# Patient Record
Sex: Female | Born: 1985 | Race: White | Hispanic: No | Marital: Married | Smoking: Former smoker
Health system: Southern US, Community
[De-identification: ages and names within clinical notes are randomized; demographics above are authoritative.]

## PROBLEM LIST (undated history)

## (undated) ENCOUNTER — Inpatient Hospital Stay (HOSPITAL_COMMUNITY): Payer: Self-pay

## (undated) DIAGNOSIS — R87619 Unspecified abnormal cytological findings in specimens from cervix uteri: Secondary | ICD-10-CM

## (undated) DIAGNOSIS — J019 Acute sinusitis, unspecified: Secondary | ICD-10-CM

## (undated) DIAGNOSIS — E349 Endocrine disorder, unspecified: Secondary | ICD-10-CM

## (undated) DIAGNOSIS — B019 Varicella without complication: Secondary | ICD-10-CM

## (undated) DIAGNOSIS — A6000 Herpesviral infection of urogenital system, unspecified: Secondary | ICD-10-CM

## (undated) DIAGNOSIS — E282 Polycystic ovarian syndrome: Secondary | ICD-10-CM

## (undated) DIAGNOSIS — IMO0002 Reserved for concepts with insufficient information to code with codable children: Secondary | ICD-10-CM

## (undated) DIAGNOSIS — E669 Obesity, unspecified: Secondary | ICD-10-CM

## (undated) DIAGNOSIS — Z8619 Personal history of other infectious and parasitic diseases: Secondary | ICD-10-CM

## (undated) HISTORY — DX: Herpesviral infection of urogenital system, unspecified: A60.00

## (undated) HISTORY — DX: Polycystic ovarian syndrome: E28.2

## (undated) HISTORY — PX: NO PAST SURGERIES: SHX2092

## (undated) HISTORY — DX: Acute sinusitis, unspecified: J01.90

## (undated) HISTORY — DX: Varicella without complication: B01.9

## (undated) HISTORY — DX: Reserved for concepts with insufficient information to code with codable children: IMO0002

## (undated) HISTORY — DX: Personal history of other infectious and parasitic diseases: Z86.19

## (undated) HISTORY — DX: Obesity, unspecified: E66.9

## (undated) HISTORY — DX: Unspecified abnormal cytological findings in specimens from cervix uteri: R87.619

---

## 2008-08-22 ENCOUNTER — Encounter: Payer: Self-pay | Admitting: Family Medicine

## 2008-09-02 DIAGNOSIS — A6000 Herpesviral infection of urogenital system, unspecified: Secondary | ICD-10-CM

## 2008-09-02 HISTORY — DX: Herpesviral infection of urogenital system, unspecified: A60.00

## 2008-12-23 ENCOUNTER — Encounter: Admission: RE | Admit: 2008-12-23 | Discharge: 2008-12-23 | Payer: Self-pay | Admitting: Endocrinology

## 2009-07-26 ENCOUNTER — Ambulatory Visit: Payer: Self-pay | Admitting: Family Medicine

## 2009-07-31 LAB — CONVERTED CEMR LAB
AST: 19 units/L (ref 0–37)
Alkaline Phosphatase: 106 units/L (ref 39–117)
Basophils Absolute: 0.1 10*3/uL (ref 0.0–0.1)
Bilirubin, Direct: 0.1 mg/dL (ref 0.0–0.3)
Calcium: 9.3 mg/dL (ref 8.4–10.5)
Creatinine, Ser: 0.6 mg/dL (ref 0.4–1.2)
Eosinophils Absolute: 0.1 10*3/uL (ref 0.0–0.7)
GFR calc non Af Amer: 133.01 mL/min (ref >60)
Glucose, Bld: 88 mg/dL (ref 70–99)
HDL: 37.4 mg/dL — ABNORMAL LOW (ref >39.00)
Hemoglobin: 13.9 g/dL (ref 12.0–15.0)
Lymphocytes Relative: 21.8 % (ref 12.0–46.0)
Monocytes Relative: 5.3 % (ref 3.0–12.0)
Neutro Abs: 6.4 10*3/uL (ref 1.4–7.7)
Neutrophils Relative %: 71.3 % (ref 43.0–77.0)
Platelets: 264 10*3/uL (ref 150.0–400.0)
RDW: 12.7 % (ref 11.5–14.6)
Sodium: 142 meq/L (ref 135–145)
Total Bilirubin: 1 mg/dL (ref 0.3–1.2)
Triglycerides: 42 mg/dL (ref 0.0–149.0)
VLDL: 8.4 mg/dL (ref 0.0–40.0)

## 2009-10-09 ENCOUNTER — Ambulatory Visit: Payer: Self-pay | Admitting: Family Medicine

## 2009-10-09 DIAGNOSIS — J019 Acute sinusitis, unspecified: Secondary | ICD-10-CM

## 2009-10-09 HISTORY — DX: Acute sinusitis, unspecified: J01.90

## 2010-10-02 NOTE — Assessment & Plan Note (Signed)
Summary: CONCERNS W/ BRONCHITIS? // RS   Vital Signs:  Patient profile:   25 year old female Menstrual status:  regular Temp:     98.1 degrees F oral BP sitting:   110 / 80  (left arm) Cuff size:   large  Vitals Entered By: Sid Falcon LPN (October 09, 2009 12:00 PM) CC: Congestion, cough X 3 weeks   History of Present Illness: Acute visit.   3 week history of intermittent cough mostly productive and nasal congestive symptoms. Intermittent maxillary facial pain. Intermittent upper teeth pain and headaches. No fever. No wheezing. Robitussin-DM for cough without much improvement. Getting very little sleep secondary to cough. Nonsmoker.  Allergies (verified): No Known Drug Allergies  Past History:  Past Medical History: Last updated: 07/26/2009 Chicken pox PCOS  Social History: Last updated: 07/26/2009 Occupation:  Certified Nursing Assistant Single Never Smoked Alcohol use-no Regular exercise-no  Review of Systems  The patient denies anorexia, fever, weight loss, syncope, dyspnea on exertion, peripheral edema, hemoptysis, and chest pain.    Physical Exam  General:  Well-developed,well-nourished,in no acute distress; alert,appropriate and cooperative throughout examination Eyes:  No corneal or conjunctival inflammation noted. EOMI. Perrla. Funduscopic exam benign, without hemorrhages, exudates or papilledema. Vision grossly normal. Ears:  External ear exam shows no significant lesions or deformities.  Otoscopic examination reveals clear canals, tympanic membranes are intact bilaterally without bulging, retraction, inflammation or discharge. Hearing is grossly normal bilaterally. Nose:  External nasal examination shows no deformity or inflammation. Nasal mucosa are pink and moist without lesions or exudates. Mouth:   yellow mucus noted posterior pharynx otherwise clear Neck:  No deformities, masses, or tenderness noted. Lungs:  Normal respiratory effort, chest expands  symmetrically. Lungs are clear to auscultation, no crackles or wheezes. Heart:  Normal rate and regular rhythm. S1 and S2 normal without gallop, murmur, click, rub or other extra sounds.   Impression & Recommendations:  Problem # 1:  SINUSITIS, ACUTE (ICD-461.9)  Her updated medication list for this problem includes:    Hydrocodone-homatropine 5-1.5 Mg/48ml Syrp (Hydrocodone-homatropine) ..... One tsp by mouth q 4-6 hours as needed cough    Amoxicillin-pot Clavulanate 875-125 Mg Tabs (Amoxicillin-pot clavulanate) ..... One by mouth two times a day for 10 days  Complete Medication List: 1)  Metformin Hcl 500 Mg Tabs (Metformin hcl) .... One tab two times a day after breakfast and dinner 2)  Hydrocodone-homatropine 5-1.5 Mg/35ml Syrp (Hydrocodone-homatropine) .... One tsp by mouth q 4-6 hours as needed cough 3)  Amoxicillin-pot Clavulanate 875-125 Mg Tabs (Amoxicillin-pot clavulanate) .... One by mouth two times a day for 10 days  Patient Instructions: 1)  Acute sinusitis symptoms for less than 10 days are not helped by antibiotics. Use warm moist compresses, and over the counter decongestants( only as directed). Call if no improvement in 5-7 days, sooner if increasing pain, fever, or new symptoms.  Prescriptions: AMOXICILLIN-POT CLAVULANATE 875-125 MG TABS (AMOXICILLIN-POT CLAVULANATE) one by mouth two times a day for 10 days  #20 x 0   Entered and Authorized by:   Evelena Peat MD   Signed by:   Evelena Peat MD on 10/09/2009   Method used:   Print then Give to Patient   RxID:   [id number] HYDROCODONE-HOMATROPINE 5-1.5 MG/5ML SYRP (HYDROCODONE-HOMATROPINE) one tsp by mouth q 4-6 hours as needed cough  #120 ml x 0   Entered and Authorized by:   Evelena Peat MD   Signed by:   Evelena Peat MD on 10/09/2009   Method used:  Print then Give to Patient   RxID:   [id number]

## 2011-02-21 ENCOUNTER — Emergency Department (HOSPITAL_COMMUNITY)
Admission: EM | Admit: 2011-02-21 | Discharge: 2011-02-21 | Disposition: A | Discharge status: Home / Self Care | Payer: Worker's Compensation | Attending: Emergency Medicine | Admitting: Emergency Medicine

## 2011-02-21 DIAGNOSIS — M5430 Sciatica, unspecified side: Secondary | ICD-10-CM | POA: Insufficient documentation

## 2011-09-03 NOTE — L&D Delivery Note (Signed)
Delivery Note Pt reached complete dilation and pushed very well with delivery of vertex within 45 minutes of pushing.  Moderate shoulder dystocia encountered, resolved with MacRoberts and woodscrew maneuver.  At 6:34 PM a viable female was delivered via  (Presentation:ROA).  APGAR: , 9; weight 7 lb 12.6 oz (3532 g).  Terminal meconium noted. Placenta status:delivered spontaneously , .  Cord:  with the following complications:none .  Anesthesia:  epidural Episiotomy: n/a Lacerations: first degree Suture Repair: 3.0 vicryl rapide Est. Blood Loss (mL): 350cc  Mom to postpartum.  Baby to stay with mother. Maternal temp noted with pushing to 100.1, will observe for resolution. Gail Browning 07/14/2012, 6:58 PM

## 2011-11-05 ENCOUNTER — Encounter: Payer: Self-pay | Admitting: Family Medicine

## 2011-11-05 ENCOUNTER — Ambulatory Visit (INDEPENDENT_AMBULATORY_CARE_PROVIDER_SITE_OTHER): Payer: Worker's Compensation | Admitting: Family Medicine

## 2011-11-05 DIAGNOSIS — J209 Acute bronchitis, unspecified: Secondary | ICD-10-CM

## 2011-11-05 MED ORDER — HYDROCODONE-HOMATROPINE 5-1.5 MG/5ML PO SYRP
5.0000 mL | ORAL_SOLUTION | Freq: Four times a day (QID) | ORAL | Status: AC | PRN
Start: 2011-11-05 — End: 2011-11-15

## 2011-11-05 NOTE — Progress Notes (Signed)
  Subjective:    Patient ID: Gail Browning, female    DOB: [birth year], 26 y.o.   MRN: [id number]  HPI  Acute visit. Patient had about 5-6 days of coughing. Mild nasal congestion. Denies any wheezing. No fever or chills. No dyspnea. Cough is especially bothersome at night.  She just  quit smoking yesterday. Denies any headache. No relief with over-the-counter medications. She works in nursing home. No sick exposures.   Review of Systems  Constitutional: Negative for fever, chills and unexpected weight change.  HENT: Positive for congestion.   Respiratory: Positive for cough. Negative for shortness of breath and wheezing.   Cardiovascular: Negative for chest pain, palpitations and leg swelling.  Gastrointestinal: Negative for abdominal pain.  Neurological: Negative for headaches.       Objective:   Physical Exam  Constitutional: She appears well-developed and well-nourished.  HENT:  Right Ear: External ear normal.  Left Ear: External ear normal.  Mouth/Throat: Oropharynx is clear and moist.  Neck: Neck supple.  Cardiovascular: Normal rate and regular rhythm.   Pulmonary/Chest: Effort normal and breath sounds normal. No respiratory distress. She has no wheezes. She has no rales.  Musculoskeletal: She exhibits no edema.  Lymphadenopathy:    She has no cervical adenopathy.          Assessment & Plan:  Acute bronchitis. Suspect viral. Hycodan cough syrup for nighttime suppression. Followup promptly for fever or worsening symptoms.

## 2011-11-05 NOTE — Patient Instructions (Signed)

## 2011-12-25 LAB — OB RESULTS CONSOLE HIV ANTIBODY (ROUTINE TESTING): HIV: NONREACTIVE

## 2011-12-25 LAB — OB RESULTS CONSOLE RPR: RPR: NONREACTIVE

## 2011-12-25 LAB — OB RESULTS CONSOLE GC/CHLAMYDIA
Chlamydia: NEGATIVE
Gonorrhea: NEGATIVE

## 2011-12-25 LAB — OB RESULTS CONSOLE HEPATITIS B SURFACE ANTIGEN: Hepatitis B Surface Ag: NEGATIVE

## 2011-12-25 LAB — OB RESULTS CONSOLE RUBELLA ANTIBODY, IGM: Rubella: IMMUNE

## 2012-02-18 ENCOUNTER — Encounter (HOSPITAL_COMMUNITY): Payer: Self-pay | Admitting: *Deleted

## 2012-02-18 ENCOUNTER — Emergency Department (HOSPITAL_COMMUNITY)
Admission: EM | Admit: 2012-02-18 | Discharge: 2012-02-18 | Disposition: A | Discharge status: Home / Self Care | Payer: No Typology Code available for payment source | Attending: Emergency Medicine | Admitting: Emergency Medicine

## 2012-02-18 ENCOUNTER — Emergency Department (HOSPITAL_COMMUNITY): Payer: No Typology Code available for payment source

## 2012-02-18 DIAGNOSIS — S39012A Strain of muscle, fascia and tendon of lower back, initial encounter: Secondary | ICD-10-CM

## 2012-02-18 DIAGNOSIS — S335XXA Sprain of ligaments of lumbar spine, initial encounter: Secondary | ICD-10-CM | POA: Insufficient documentation

## 2012-02-18 DIAGNOSIS — O9989 Other specified diseases and conditions complicating pregnancy, childbirth and the puerperium: Secondary | ICD-10-CM | POA: Insufficient documentation

## 2012-02-18 DIAGNOSIS — Z3490 Encounter for supervision of normal pregnancy, unspecified, unspecified trimester: Secondary | ICD-10-CM

## 2012-02-18 MED ORDER — HYDROCODONE-ACETAMINOPHEN 5-325 MG PO TABS: 1.0000 | ORAL_TABLET | Freq: Four times a day (QID) | ORAL | Status: DC | PRN

## 2012-02-18 MED ORDER — HYDROCODONE-ACETAMINOPHEN 5-325 MG PO TABS: 1.0000 | ORAL_TABLET | Freq: Four times a day (QID) | ORAL | Status: AC | PRN

## 2012-02-18 NOTE — ED Notes (Signed)
Patient transported to Ultrasound. Thayer Ohm, [place] went to bedside to speak with pt and mother after talking with OB MD.

## 2012-02-18 NOTE — Discharge Instructions (Signed)
Followup with your OB/GYN.  Return here as needed.  Go to [facility] for any worsening in your condition.

## 2012-02-18 NOTE — ED Notes (Signed)
Pt's mother arrived to bedside and upset that OB Rapid Response Nurse not at bedside. Informed pt's mother that they were contacted and recommended fetal heart tones and ultrasound. Pt's mother RN at [facility]. Also requesting pt's OBGYN to be contacted. Thayer Ohm, [place] made aware of mother's concerns and has placed call to OBGYN.

## 2012-02-18 NOTE — ED Notes (Signed)
Dr. Ellyn Hack at bedside seeing pt.

## 2012-02-18 NOTE — ED Provider Notes (Signed)
Medical screening examination/treatment/procedure(s) were performed by non-physician practitioner and as supervising physician I was immediately available for consultation/collaboration.   Loren Racer, MD 02/18/12 [phone]
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
## 2012-07-05 ENCOUNTER — Inpatient Hospital Stay (HOSPITAL_COMMUNITY)
Admission: AD | Admit: 2012-07-05 | Discharge: 2012-07-05 | Disposition: A | Discharge status: Home / Self Care | Payer: PRIVATE HEALTH INSURANCE | Source: Ambulatory Visit | Attending: Obstetrics and Gynecology | Admitting: Obstetrics and Gynecology

## 2012-07-05 ENCOUNTER — Encounter (HOSPITAL_COMMUNITY): Payer: Self-pay | Admitting: *Deleted

## 2012-07-05 DIAGNOSIS — O479 False labor, unspecified: Secondary | ICD-10-CM | POA: Insufficient documentation

## 2012-07-05 MED ORDER — ZOLPIDEM TARTRATE 5 MG PO TABS
ORAL_TABLET | ORAL | Status: AC
  Administered 2012-07-05: 10 mg
  Filled 2012-07-05: qty 2

## 2012-07-05 NOTE — MAU Note (Signed)
Pt was in MAU during epic downtime. Plan of care was discussed with Dr. Senaida Ores @ [phone]. Pt was to walk for 1-2 hours then return for a recheck. Ambien 10 mg ordered per Dr. Senaida Ores if pt makes no change and sent home. Ambien was charted in epic. Order was sent to pharmacy on paper.

## 2012-07-05 NOTE — MAU Note (Signed)
Pt reports cramping for 3 hours

## 2012-07-06 ENCOUNTER — Telehealth (HOSPITAL_COMMUNITY): Payer: Self-pay | Admitting: *Deleted

## 2012-07-07 ENCOUNTER — Encounter (HOSPITAL_COMMUNITY): Payer: Self-pay | Admitting: *Deleted

## 2012-07-07 NOTE — Telephone Encounter (Signed)
Preadmission screen  

## 2012-07-08 ENCOUNTER — Encounter (HOSPITAL_COMMUNITY): Payer: Self-pay | Admitting: *Deleted

## 2012-07-08 ENCOUNTER — Telehealth (HOSPITAL_COMMUNITY): Payer: Self-pay | Admitting: *Deleted

## 2012-07-08 NOTE — Telephone Encounter (Signed)
Preadmission screen  

## 2012-07-12 ENCOUNTER — Inpatient Hospital Stay (HOSPITAL_COMMUNITY)
Admission: AD | Admit: 2012-07-12 | Discharge: 2012-07-12 | Disposition: A | Discharge status: Home / Self Care | Payer: PRIVATE HEALTH INSURANCE | Source: Ambulatory Visit | Attending: Obstetrics and Gynecology | Admitting: Obstetrics and Gynecology

## 2012-07-12 ENCOUNTER — Encounter (HOSPITAL_COMMUNITY): Payer: Self-pay

## 2012-07-12 DIAGNOSIS — O479 False labor, unspecified: Secondary | ICD-10-CM | POA: Insufficient documentation

## 2012-07-12 HISTORY — DX: Endocrine disorder, unspecified: E34.9

## 2012-07-12 NOTE — MAU Note (Signed)
Patient states she has been having contractions every 4 minutes. Had heavy discharge yesterday and started having to wear a pad with constant leaking since 1300 on 11-9. Reports good fetal movement. No bleeding.

## 2012-07-14 ENCOUNTER — Inpatient Hospital Stay (HOSPITAL_COMMUNITY): Payer: PRIVATE HEALTH INSURANCE | Admitting: Anesthesiology

## 2012-07-14 ENCOUNTER — Encounter (HOSPITAL_COMMUNITY): Payer: Self-pay | Admitting: *Deleted

## 2012-07-14 ENCOUNTER — Inpatient Hospital Stay (HOSPITAL_COMMUNITY)
Admission: AD | Admit: 2012-07-14 | Discharge: 2012-07-16 | DRG: 774 | Disposition: A | Discharge status: Home / Self Care | Payer: PRIVATE HEALTH INSURANCE | Source: Ambulatory Visit | Attending: Obstetrics and Gynecology | Admitting: Obstetrics and Gynecology

## 2012-07-14 ENCOUNTER — Encounter (HOSPITAL_COMMUNITY): Payer: Self-pay | Admitting: Anesthesiology

## 2012-07-14 LAB — CBC
HCT: 37.8 % (ref 36.0–46.0)
Hemoglobin: 12.8 g/dL (ref 12.0–15.0)
MCV: 89.6 fL (ref 78.0–100.0)
RBC: 4.22 MIL/uL (ref 3.87–5.11)
WBC: 17.6 10*3/uL — ABNORMAL HIGH (ref 4.0–10.5)

## 2012-07-14 LAB — ABO/RH: ABO/RH(D): A POS

## 2012-07-14 LAB — TYPE AND SCREEN: Antibody Screen: NEGATIVE

## 2012-07-14 MED ORDER — DIPHENHYDRAMINE HCL 50 MG/ML IJ SOLN
12.5000 mg | INTRAMUSCULAR | Status: DC | PRN
Start: 2012-07-14 — End: 2012-07-14

## 2012-07-14 MED ORDER — SENNOSIDES-DOCUSATE SODIUM 8.6-50 MG PO TABS
2.0000 | ORAL_TABLET | Freq: Every day | ORAL | Status: DC
  Administered 2012-07-15: 2 via ORAL

## 2012-07-14 MED ORDER — LACTATED RINGERS IV SOLN: 500.0000 mL | INTRAVENOUS | Status: DC | PRN

## 2012-07-14 MED ORDER — SIMETHICONE 80 MG PO CHEW: 80.0000 mg | CHEWABLE_TABLET | ORAL | Status: DC | PRN

## 2012-07-14 MED ORDER — OXYTOCIN 40 UNITS IN LACTATED RINGERS INFUSION - SIMPLE MED
62.5000 mL/h | INTRAVENOUS | Status: DC
  Administered 2012-07-14: 62.5 mL/h via INTRAVENOUS
  Filled 2012-07-14: qty 1000

## 2012-07-14 MED ORDER — PRENATAL MULTIVITAMIN CH: 1.0000 | ORAL_TABLET | Freq: Every day | ORAL | Status: DC

## 2012-07-14 MED ORDER — LIDOCAINE HCL (PF) 1 % IJ SOLN
INTRAMUSCULAR | Status: DC | PRN
  Administered 2012-07-14 (×2): 9 mL

## 2012-07-14 MED ORDER — IBUPROFEN 600 MG PO TABS: 600.0000 mg | ORAL_TABLET | Freq: Four times a day (QID) | ORAL | Status: DC | PRN

## 2012-07-14 MED ORDER — CITRIC ACID-SODIUM CITRATE 334-500 MG/5ML PO SOLN: 30.0000 mL | ORAL | Status: DC | PRN

## 2012-07-14 MED ORDER — DIBUCAINE 1 % RE OINT: 1.0000 "application " | TOPICAL_OINTMENT | RECTAL | Status: DC | PRN

## 2012-07-14 MED ORDER — LACTATED RINGERS IV SOLN: 500.0000 mL | Freq: Once | INTRAVENOUS | Status: DC

## 2012-07-14 MED ORDER — BUTORPHANOL TARTRATE 1 MG/ML IJ SOLN
1.0000 mg | INTRAMUSCULAR | Status: DC | PRN
  Administered 2012-07-14: 1 mg via INTRAVENOUS
  Filled 2012-07-14: qty 1

## 2012-07-14 MED ORDER — METFORMIN HCL 500 MG PO TABS
500.0000 mg | ORAL_TABLET | Freq: Two times a day (BID) | ORAL | Status: DC
  Administered 2012-07-15 – 2012-07-16 (×3): 500 mg via ORAL
  Filled 2012-07-14 (×3): qty 1

## 2012-07-14 MED ORDER — WITCH HAZEL-GLYCERIN EX PADS: 1.0000 "application " | MEDICATED_PAD | CUTANEOUS | Status: DC | PRN

## 2012-07-14 MED ORDER — OXYTOCIN BOLUS FROM INFUSION: 500.0000 mL | INTRAVENOUS | Status: DC

## 2012-07-14 MED ORDER — ONDANSETRON HCL 4 MG/2ML IJ SOLN: 4.0000 mg | INTRAMUSCULAR | Status: DC | PRN

## 2012-07-14 MED ORDER — OXYCODONE-ACETAMINOPHEN 5-325 MG PO TABS
1.0000 | ORAL_TABLET | ORAL | Status: DC | PRN
Start: 2012-07-14 — End: 2012-07-14

## 2012-07-14 MED ORDER — TETANUS-DIPHTH-ACELL PERTUSSIS 5-2.5-18.5 LF-MCG/0.5 IM SUSP: 0.5000 mL | Freq: Once | INTRAMUSCULAR | Status: DC

## 2012-07-14 MED ORDER — ACETAMINOPHEN 325 MG PO TABS: 650.0000 mg | ORAL_TABLET | ORAL | Status: DC | PRN

## 2012-07-14 MED ORDER — ZOLPIDEM TARTRATE 5 MG PO TABS: 5.0000 mg | ORAL_TABLET | Freq: Every evening | ORAL | Status: DC | PRN

## 2012-07-14 MED ORDER — EPHEDRINE 5 MG/ML INJ
10.0000 mg | INTRAVENOUS | Status: DC | PRN
  Filled 2012-07-14: qty 4

## 2012-07-14 MED ORDER — ONDANSETRON HCL 4 MG PO TABS: 4.0000 mg | ORAL_TABLET | ORAL | Status: DC | PRN

## 2012-07-14 MED ORDER — PRENATAL MULTIVITAMIN CH
1.0000 | ORAL_TABLET | Freq: Every day | ORAL | Status: DC
  Administered 2012-07-15 – 2012-07-16 (×2): 1 via ORAL
  Filled 2012-07-14 (×2): qty 1

## 2012-07-14 MED ORDER — FENTANYL 2.5 MCG/ML BUPIVACAINE 1/10 % EPIDURAL INFUSION (WH - ANES)
INTRAMUSCULAR | Status: DC | PRN
  Administered 2012-07-14: 14 mL/h via EPIDURAL

## 2012-07-14 MED ORDER — LACTATED RINGERS IV SOLN
INTRAVENOUS | Status: DC
  Administered 2012-07-14: 12:00:00 via INTRAVENOUS

## 2012-07-14 MED ORDER — LIDOCAINE HCL (PF) 1 % IJ SOLN
30.0000 mL | INTRAMUSCULAR | Status: DC | PRN
  Filled 2012-07-14: qty 30

## 2012-07-14 MED ORDER — OXYCODONE-ACETAMINOPHEN 5-325 MG PO TABS: 1.0000 | ORAL_TABLET | ORAL | Status: DC | PRN

## 2012-07-14 MED ORDER — EPHEDRINE 5 MG/ML INJ: 10.0000 mg | INTRAVENOUS | Status: DC | PRN

## 2012-07-14 MED ORDER — DIPHENHYDRAMINE HCL 25 MG PO CAPS: 25.0000 mg | ORAL_CAPSULE | Freq: Four times a day (QID) | ORAL | Status: DC | PRN

## 2012-07-14 MED ORDER — BENZOCAINE-MENTHOL 20-0.5 % EX AERO: 1.0000 "application " | INHALATION_SPRAY | CUTANEOUS | Status: DC | PRN

## 2012-07-14 MED ORDER — FENTANYL 2.5 MCG/ML BUPIVACAINE 1/10 % EPIDURAL INFUSION (WH - ANES)
14.0000 mL/h | INTRAMUSCULAR | Status: DC
  Filled 2012-07-14: qty 125

## 2012-07-14 MED ORDER — LANOLIN HYDROUS EX OINT: TOPICAL_OINTMENT | CUTANEOUS | Status: DC | PRN

## 2012-07-14 MED ORDER — PHENYLEPHRINE 40 MCG/ML (10ML) SYRINGE FOR IV PUSH (FOR BLOOD PRESSURE SUPPORT): 80.0000 ug | PREFILLED_SYRINGE | INTRAVENOUS | Status: DC | PRN

## 2012-07-14 MED ORDER — IBUPROFEN 600 MG PO TABS
600.0000 mg | ORAL_TABLET | Freq: Four times a day (QID) | ORAL | Status: DC
  Administered 2012-07-14 – 2012-07-16 (×6): 600 mg via ORAL
  Filled 2012-07-14 (×6): qty 1

## 2012-07-14 MED ORDER — ONDANSETRON HCL 4 MG/2ML IJ SOLN
4.0000 mg | Freq: Four times a day (QID) | INTRAMUSCULAR | Status: DC | PRN
  Administered 2012-07-14: 4 mg via INTRAVENOUS
  Filled 2012-07-14: qty 2

## 2012-07-14 MED ORDER — PHENYLEPHRINE 40 MCG/ML (10ML) SYRINGE FOR IV PUSH (FOR BLOOD PRESSURE SUPPORT)
80.0000 ug | PREFILLED_SYRINGE | INTRAVENOUS | Status: DC | PRN
  Filled 2012-07-14: qty 5

## 2012-07-14 NOTE — Anesthesia Preprocedure Evaluation (Signed)
Anesthesia Evaluation  Patient identified by MRN, date of birth, ID band Patient awake    Reviewed: Allergy & Precautions, H&P , NPO status , Patient's Chart, lab work & pertinent test results  Airway Mallampati: III TM Distance: >3 FB Neck ROM: full    Dental No notable dental hx.    Pulmonary neg pulmonary ROS,    Pulmonary exam normal       Cardiovascular negative cardio ROS      Neuro/Psych negative neurological ROS  negative psych ROS   GI/Hepatic negative GI ROS, Neg liver ROS,   Endo/Other  Morbid obesity  Renal/GU negative Renal ROS  negative genitourinary   Musculoskeletal negative musculoskeletal ROS (+)   Abdominal (+) + obese,   Peds negative pediatric ROS (+)  Hematology negative hematology ROS (+)   Anesthesia Other Findings   Reproductive/Obstetrics (+) Pregnancy                           Anesthesia Physical Anesthesia Plan  ASA: III  Anesthesia Plan: Epidural   Post-op Pain Management:    Induction:   Airway Management Planned:   Additional Equipment:   Intra-op Plan:   Post-operative Plan:   Informed Consent: I have reviewed the patients History and Physical, chart, labs and discussed the procedure including the risks, benefits and alternatives for the proposed anesthesia with the patient or authorized representative who has indicated his/her understanding and acceptance.     Plan Discussed with:   Anesthesia Plan Comments:         Anesthesia Quick Evaluation  

## 2012-07-14 NOTE — Anesthesia Procedure Notes (Addendum)
Epidural  Start time: 07/14/2012 1:02 PM End time: 07/14/2012 1:06 PM  Staffing Anesthesiologist: Sandrea Hughs Performed by: anesthesiologist   Preanesthetic Checklist Completed: patient identified, site marked, surgical consent, pre-op evaluation, timeout performed, IV checked, risks and benefits discussed and monitors and equipment checked  Epidural Patient position: sitting Prep: DuraPrep Patient monitoring: continuous pulse ox, blood pressure and heart rate Approach: midline Injection technique: LOR air  Needle:  Needle type: Tuohy  Needle gauge: 17 G Needle length: 9 cm Needle insertion depth: 8 cm Catheter type: closed end flexible Catheter at skin depth: 13 cm Test dose: Other  Assessment Sensory level: T8  Additional Notes Reason for block:procedure for pain

## 2012-07-14 NOTE — Progress Notes (Signed)
   Subjective: Pt comfortable with epidural  Objective: BP 101/58  Pulse 96  Temp 98.3 F (36.8 C)  Ht 5' 4.5" (1.638 m)  Wt 119.296 kg (263 lb)  BMI 44.45 kg/m2  SpO2 100%  LMP 10/08/2011      FHT:  FHR: 130 bpm, variability: moderate,  accelerations:  Present,  decelerations:  Absent UC:   regular, every 3-5 minutes SVE:  C/5/-1   AROM clear--IUPC placed  Labs: Lab Results  Component Value Date   WBC 17.6* 07/14/2012   HGB 12.8 07/14/2012   HCT 37.8 07/14/2012   MCV 89.6 07/14/2012   PLT 236 07/14/2012    Assessment / Plan: Follow progress.  Gail Browning 07/14/2012, 1:58 PM

## 2012-07-14 NOTE — H&P (Signed)
Gail Browning is a 26 y.o. female G1P0 at 39+ weeks (07/18/12 by LMP c/w 8 weeks) presenting for painful contractions and cervical change.  Prenatal care has been relatively uneventful.  SHe has a history of PCOS and has required metformin in the past for high testosterone levels.  Maternal Medical History:  Reason for admission: Reason for admission: contractions.  Contractions: Onset was 6-12 hours ago.   Frequency: regular.   Perceived severity is strong.    Fetal activity: Perceived fetal activity is normal.      OB History    Grav Para Term Preterm Abortions TAB SAB Ect Mult Living   1 0 0 0 0 0 0 0 0 0      Past Medical History  Diagnosis Date  . SINUSITIS, ACUTE 10/09/2009  . Chicken pox   . PCOS (polycystic ovarian syndrome)   . Hx of chlamydia infection   . Abnormal Pap smear   . Obese   . Endocrine disorder     high testosterone. treated with metformin  . 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    Past Surgical History  Procedure Date  . No past surgeries    Family History: family history includes Arthritis in her other; COPD in her father; Cancer in her father and other; Diabetes in her maternal grandfather; Hyperlipidemia in her other; Hypertension in her father, maternal grandmother, and other; Seizures in her father; and Stroke in her other. Social History:  reports that she quit smoking about 8 months ago. Her smoking use included Cigarettes. She has a 1.5 pack-year smoking history. She does not have any smokeless tobacco history on file. She reports that she does not drink alcohol or use illicit drugs.   Prenatal Transfer Tool  Maternal Diabetes: No Genetic Screening: Declined Maternal Ultrasounds/Referrals: Normal Fetal Ultrasounds or other Referrals:  None Maternal Substance Abuse:  No Significant Maternal Medications:  None Significant Maternal Lab Results:  None Other Comments:  None  Review of Systems  Neurological:  Negative for headaches.    Dilation: 5 Effacement (%): 100 Station: -2 Exam by:: m wilkins rnc Blood pressure 110/59, pulse 87, temperature 98.3 F (36.8 C), height 5' 4.5" (1.638 m), weight 119.296 kg (263 lb), last menstrual period 10/08/2011, SpO2 100.00%. Maternal Exam:  Uterine Assessment: Contraction strength is firm.  Contraction frequency is regular.   Abdomen: Patient reports no abdominal tenderness. Fetal presentation: vertex  Introitus: Normal vulva. Normal vagina.    Physical Exam  Constitutional: She is oriented to person, place, and time. She appears well-developed and well-nourished.  Cardiovascular: Normal rate and regular rhythm.   Respiratory: Effort normal.  GI: Soft.  Genitourinary: Vagina normal and uterus normal.  Neurological: She is alert and oriented to person, place, and time.  Psychiatric: She has a normal mood and affect. Her behavior is normal.    Prenatal labs: ABO, Rh: A/Positive/-- (04/24 0000) Antibody: Negative (04/24 0000) Rubella: Immune (04/24 0000) RPR: Nonreactive (04/24 0000)  HBsAg: Negative (04/24 0000)  HIV: Non-reactive (04/24 0000)  GBS: Negative (10/18 0000) One hour GTT 92 Hgb AA Declined genetic screens  Assessment/Plan: Pt admitted in labor--plans epidural.  WIll AROM after epidural.   Huel Cote W 07/14/2012, 1:30 PM

## 2012-07-15 ENCOUNTER — Encounter (HOSPITAL_COMMUNITY): Payer: Self-pay | Admitting: *Deleted

## 2012-07-15 LAB — CBC
MCV: 89.2 fL (ref 78.0–100.0)
Platelets: 222 10*3/uL (ref 150–400)
RBC: 3.53 MIL/uL — ABNORMAL LOW (ref 3.87–5.11)
WBC: 20.9 10*3/uL — ABNORMAL HIGH (ref 4.0–10.5)

## 2012-07-15 NOTE — Progress Notes (Signed)
UR chart review completed.  

## 2012-07-15 NOTE — Anesthesia Postprocedure Evaluation (Signed)
  Anesthesia Post-op Note  Patient: Gail Browning  Procedure(s) Performed: * No procedures listed *  Patient Location: PACU and Mother/Baby  Anesthesia Type:Epidural  Level of Consciousness: awake, alert  and oriented  Airway and Oxygen Therapy: Patient Spontanous Breathing  Post-op Pain: none  Post-op Assessment: Post-op Vital signs reviewed  Post-op Vital Signs: Reviewed and stable  Complications: No apparent anesthesia complications

## 2012-07-15 NOTE — Progress Notes (Signed)
Patient ID: Gail Browning, female   DOB: 11-Sep-1985, 26 y.o.   MRN: [id number] PPD#1 Fever resolved since delivery Pain controlled, normal lochia Will continue care

## 2012-07-16 ENCOUNTER — Inpatient Hospital Stay (HOSPITAL_COMMUNITY): Admission: RE | Admit: 2012-07-16 | Payer: No Typology Code available for payment source | Source: Ambulatory Visit

## 2012-07-16 MED ORDER — IBUPROFEN 600 MG PO TABS: 600.0000 mg | ORAL_TABLET | Freq: Four times a day (QID) | ORAL | Status: DC

## 2012-07-16 MED ORDER — OXYCODONE-ACETAMINOPHEN 5-325 MG PO TABS
1.0000 | ORAL_TABLET | ORAL | Status: DC | PRN
Start: 2012-07-16 — End: 2013-09-21

## 2012-07-16 NOTE — Progress Notes (Signed)
Post Partum Day 1 Subjective: no complaints, up ad lib and tolerating PO  Objective: Blood pressure 98/62, pulse 87, temperature 98.2 F (36.8 C), temperature source Oral, resp. rate 18, height 5' 4.5" (1.638 m), weight 119.296 kg (263 lb), last menstrual period 10/08/2011, SpO2 96.00%, unknown if currently breastfeeding.  Physical Exam:  General: alert and cooperative Lochia: appropriate Uterine Fundus: firm    Basename 07/15/12 0540 07/14/12 1200  HGB 10.6* 12.8  HCT 31.5* 37.8    Assessment/Plan: Discharge home Motrin and percocet   LOS: 2 days   Catharina Pica W 07/16/2012, 8:45 AM

## 2012-07-16 NOTE — Discharge Summary (Signed)
Obstetric Discharge Summary Reason for Admission: onset of labor Prenatal Procedures: none Intrapartum Procedures: spontaneous vaginal delivery Postpartum Procedures: none Complications-Operative and Postpartum: first degree perineal laceration Hemoglobin  Date Value Range Status  07/15/2012 10.6* 12.0 - 15.0 g/dL Final     DELTA CHECK NOTED     REPEATED TO VERIFY     HCT  Date Value Range Status  07/15/2012 31.5* 36.0 - 46.0 % Final    Physical Exam:  General: alert and cooperative Lochia: appropriate Uterine Fundus: firm   Discharge Diagnoses: Term Pregnancy-delivered  Discharge Information: Date: 07/16/2012 Activity: pelvic rest Diet: routine Medications: Ibuprofen and Percocet Condition: improved Instructions: refer to practice specific booklet Discharge to: home Follow-up Information    Follow up with Oliver Pila, MD. Schedule an appointment as soon as possible for a visit in 6 weeks.   Contact information:   [address], [address] [place] [address] [phone]          Newborn Data: Live born female  Birth Weight: 7 lb 12.6 oz (3532 g) APGAR: 9, 9  Home with mother.  Oliver Pila 07/16/2012, 8:48 AM

## 2012-07-20 ENCOUNTER — Inpatient Hospital Stay (HOSPITAL_COMMUNITY): Admission: RE | Admit: 2012-07-20 | Payer: No Typology Code available for payment source | Source: Ambulatory Visit

## 2013-03-01 ENCOUNTER — Telehealth: Payer: Self-pay | Admitting: Family Medicine

## 2013-03-01 NOTE — Telephone Encounter (Signed)
Generally NOT spread person to person, so no indication for prophylactic antibiotics.  Difficult to answer without knowing more about specifics of how patient acquired the infection. Frequently, Legionnaires from contaminated water supplies. We can see her is she needs to discuss specifics.

## 2013-03-01 NOTE — Telephone Encounter (Signed)
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 Pt is asymptomatic.  Mom would like to know if she needs to be treated prophylactically. PLEASE REVIEW W/ MD AND F/U W/ MOM.  Symptoms  Reason For Call & Symptoms: Legionnaires Disease questions  Reviewed Health History In EMR: N/A  Reviewed Medications In EMR: N/A  Reviewed Allergies In EMR: N/A  Reviewed Surgeries / Procedures: N/A  Date of Onset of Symptoms: 03/01/2013 OB / GYN:  LMP: Unknown  Guideline(s) Used:  No Protocol Available - Information Only  Disposition Per Guideline:   Discuss with PCP and Callback by Nurse Today  Reason For Disposition Reached:   Nursing judgment  Advice Given:  N/A  Patient Will Follow Care Advice:  YES

## 2013-03-02 NOTE — Telephone Encounter (Signed)
Left message for pt to call back  °

## 2013-03-03 NOTE — Telephone Encounter (Signed)
Left message for pt to call back  °

## 2013-06-28 LAB — OB RESULTS CONSOLE ABO/RH: RH Type: POSITIVE

## 2013-06-28 LAB — OB RESULTS CONSOLE RPR: RPR: NONREACTIVE

## 2013-06-28 LAB — OB RESULTS CONSOLE HIV ANTIBODY (ROUTINE TESTING): HIV: NONREACTIVE

## 2013-06-28 LAB — OB RESULTS CONSOLE HEPATITIS B SURFACE ANTIGEN: HEP B S AG: NEGATIVE

## 2013-06-28 LAB — OB RESULTS CONSOLE RUBELLA ANTIBODY, IGM: Rubella: IMMUNE

## 2013-06-28 LAB — OB RESULTS CONSOLE ANTIBODY SCREEN: ANTIBODY SCREEN: NEGATIVE

## 2013-08-20 LAB — OB RESULTS CONSOLE GC/CHLAMYDIA
CHLAMYDIA, DNA PROBE: NEGATIVE
Gonorrhea: NEGATIVE

## 2013-09-02 NOTE — L&D Delivery Note (Signed)
Delivery Note Patient pushed well.  At 10:04 AM a viable female was delivered via  (Presentation: LOA).  APGAR: 8,9 ; weight pending .   Placenta status: delivered spontaneously .  Cord:  with the following complications: none  Anesthesia: Epidural  Episiotomy: none Lacerations: none Suture Repair:n/a Est. Blood Loss (mL): 350cc  Mom to postpartum.  Baby to Couplet care / Skin to Skin.  Pt counseled on risks and benefits of pp BTL, she was quoted a risk of failure of 1/100 and increased risk of tubal pregnancy if pregnancy occurs. Pt desires to proceed.  Will remain NPO and do procedure at 5pm.  Oliver PilaKathy W Richelle Glick 12/30/2013, 10:28 AM

## 2013-09-21 ENCOUNTER — Ambulatory Visit: Payer: PRIVATE HEALTH INSURANCE | Admitting: Family Medicine

## 2013-09-21 ENCOUNTER — Ambulatory Visit (INDEPENDENT_AMBULATORY_CARE_PROVIDER_SITE_OTHER): Payer: PRIVATE HEALTH INSURANCE | Admitting: Family Medicine

## 2013-09-21 ENCOUNTER — Encounter: Payer: Self-pay | Admitting: Family Medicine

## 2013-09-21 VITALS — BP 116/64 | HR 124 | Temp 97.9°F | Ht 64.5 in | Wt 254.0 lb

## 2013-09-21 DIAGNOSIS — J019 Acute sinusitis, unspecified: Secondary | ICD-10-CM

## 2013-09-21 MED ORDER — AMOXICILLIN 875 MG PO TABS: 875.0000 mg | ORAL_TABLET | Freq: Two times a day (BID) | ORAL | Status: DC

## 2013-09-21 NOTE — Progress Notes (Signed)
   Subjective:    Patient ID: Gail Browning, female    DOB: 04/20/1986, 28 y.o.   MRN: [id number]  HPI Here for 4 days of sinus pressure, PND, and a dry cough. No fever. She is 6 months pregnant.    Review of Systems  Constitutional: Negative.   HENT: Positive for congestion, postnasal drip and sinus pressure.   Eyes: Negative.   Respiratory: Positive for cough.        Objective:   Physical Exam  Constitutional: She appears well-developed and well-nourished.  HENT:  Right Ear: External ear normal.  Left Ear: External ear normal.  Nose: Nose normal.  Mouth/Throat: Oropharynx is clear and moist.  Eyes: Conjunctivae are normal.  Pulmonary/Chest: Effort normal and breath sounds normal.  Lymphadenopathy:    She has no cervical adenopathy.          Assessment & Plan:  Add Robitussin prn

## 2013-09-21 NOTE — Progress Notes (Signed)
Pre visit review using our clinic review tool, if applicable. No additional management support is needed unless otherwise documented below in the visit note. 

## 2013-12-03 ENCOUNTER — Inpatient Hospital Stay (HOSPITAL_COMMUNITY)
Admission: AD | Admit: 2013-12-03 | Discharge: 2013-12-03 | Disposition: A | Discharge status: Home / Self Care | Payer: PRIVATE HEALTH INSURANCE | Source: Ambulatory Visit | Attending: Obstetrics and Gynecology | Admitting: Obstetrics and Gynecology

## 2013-12-03 NOTE — MAU Note (Signed)
Patient states she has been having NST's on Tuesday's and Fridays in the office due to polyhydraminios. Patient asking how long it will take to do the NST. Explained there is no way to know how long it will take, it will depend on the baby movement and reactivity. Maybe 45 minutes to one hour or longer but could possible last longer. Patient states she has somewhere she has to be and can not wait to have the NST. States she will return tomorrow for the testing. Encouraged patient to have NST today but declined today.

## 2013-12-04 ENCOUNTER — Encounter (HOSPITAL_COMMUNITY): Payer: Self-pay | Admitting: *Deleted

## 2013-12-04 ENCOUNTER — Inpatient Hospital Stay (HOSPITAL_COMMUNITY)
Admission: AD | Admit: 2013-12-04 | Discharge: 2013-12-04 | Disposition: A | Discharge status: Home / Self Care | Payer: Medicaid Other | Source: Ambulatory Visit | Attending: Obstetrics and Gynecology | Admitting: Obstetrics and Gynecology

## 2013-12-04 DIAGNOSIS — O409XX0 Polyhydramnios, unspecified trimester, not applicable or unspecified: Secondary | ICD-10-CM | POA: Insufficient documentation

## 2013-12-04 NOTE — MAU Note (Signed)
Here for bi-wkly NST, getting them in office- being followed "too much fluid", was here yesterday - was unable to stay

## 2013-12-15 LAB — OB RESULTS CONSOLE GBS: GBS: NEGATIVE

## 2013-12-23 ENCOUNTER — Inpatient Hospital Stay (HOSPITAL_COMMUNITY)
Admission: AD | Admit: 2013-12-23 | Discharge: 2013-12-23 | Disposition: A | Discharge status: Home / Self Care | Payer: Medicaid Other | Source: Ambulatory Visit | Attending: Obstetrics and Gynecology | Admitting: Obstetrics and Gynecology

## 2013-12-23 ENCOUNTER — Encounter (HOSPITAL_COMMUNITY): Payer: Self-pay | Admitting: *Deleted

## 2013-12-23 DIAGNOSIS — M545 Low back pain, unspecified: Secondary | ICD-10-CM | POA: Insufficient documentation

## 2013-12-23 DIAGNOSIS — O479 False labor, unspecified: Secondary | ICD-10-CM | POA: Insufficient documentation

## 2013-12-23 NOTE — MAU Note (Signed)
Presents with contractions since this am, now 3-4 minutes apart and having sharp pain in her lower back., denies bleeding or ROM.

## 2013-12-23 NOTE — Discharge Instructions (Signed)
Braxton Hicks Contractions Pregnancy is commonly associated with contractions of the uterus throughout the pregnancy. Towards the end of pregnancy (32 to 34 weeks), these contractions (Braxton Hicks) can develop more often and may become more forceful. This is not true labor because these contractions do not result in opening (dilatation) and thinning of the cervix. They are sometimes difficult to tell apart from true labor because these contractions can be forceful and people have different pain tolerances. You should not feel embarrassed if you go to the hospital with false labor. Sometimes, the only way to tell if you are in true labor is for your caregiver to follow the changes in the cervix. How to tell the difference between true and false labor:  False labor.  The contractions of false labor are usually shorter, irregular and not as hard as those of true labor.  They are often felt in the front of the lower abdomen and in the groin.  They may leave with walking around or changing positions while lying down.  They get weaker and are shorter lasting as time goes on.  These contractions are usually irregular.  They do not usually become progressively stronger, regular and closer together as with true labor.  True labor.  Contractions in true labor last 30 to 70 seconds, become very regular, usually become more intense, and increase in frequency.  They do not go away with walking.  The discomfort is usually felt in the top of the uterus and spreads to the lower abdomen and low back.  True labor can be determined by your caregiver with an exam. This will show that the cervix is dilating and getting thinner. If there are no prenatal problems or other health problems associated with the pregnancy, it is completely safe to be sent home with false labor and await the onset of true labor. HOME CARE INSTRUCTIONS   Keep up with your usual exercises and instructions.  Take medications as  directed.  Keep your regular prenatal appointment.  Eat and drink lightly if you think you are going into labor.  If BH contractions are making you uncomfortable:  Change your activity position from lying down or resting to walking/walking to resting.  Sit and rest in a tub of warm water.  Drink 2 to 3 glasses of water. Dehydration may cause B-H contractions.  Do slow and deep breathing several times an hour. SEEK IMMEDIATE MEDICAL CARE IF:   Your contractions continue to become stronger, more regular, and closer together.  You have a gushing, burst or leaking of fluid from the vagina.  An oral temperature above 102 F (38.9 C) develops.  You have passage of blood-tinged mucus.  You develop vaginal bleeding.  You develop continuous belly (abdominal) pain.  You have low back pain that you never had before.  You feel the baby's head pushing down causing pelvic pressure.  The baby is not moving as much as it used to. Document Released: 08/19/2005 Document Revised: 11/11/2011 Document Reviewed: 05/31/2013 [employer] Patient Information 2014 [employer].  Fetal Movement Counts Patient Name: __________________________________________________ Patient Due Date: ____________________ Performing a fetal movement count is highly recommended in high-risk pregnancies, but it is good for every pregnant woman to do. Your caregiver may ask you to start counting fetal movements at 28 weeks of the pregnancy. Fetal movements often increase:  After eating a full meal.  After physical activity.  After eating or drinking something sweet or cold.  At rest. Pay attention to when you feel   the baby is most active. This will help you notice a pattern of your baby's sleep and wake cycles and what factors contribute to an increase in fetal movement. It is important to perform a fetal movement count at the same time each day when your baby is normally most active.  HOW TO COUNT FETAL  MOVEMENTS 1. Find a quiet and comfortable area to sit or lie down on your left side. Lying on your left side provides the best blood and oxygen circulation to your baby. 2. Write down the day and time on a sheet of paper or in a journal. 3. Start counting kicks, flutters, swishes, rolls, or jabs in a 2 hour period. You should feel at least 10 movements within 2 hours. 4. If you do not feel 10 movements in 2 hours, wait 2 3 hours and count again. Look for a change in the pattern or not enough counts in 2 hours. SEEK MEDICAL CARE IF:  You feel less than 10 counts in 2 hours, tried twice.  There is no movement in over an hour.  The pattern is changing or taking longer each day to reach 10 counts in 2 hours.  You feel the baby is not moving as he or she usually does. Date: ____________ Movements: ____________ Start time: ____________ Finish time: ____________  Date: ____________ Movements: ____________ Start time: ____________ Finish time: ____________ Date: ____________ Movements: ____________ Start time: ____________ Finish time: ____________ Date: ____________ Movements: ____________ Start time: ____________ Finish time: ____________ Date: ____________ Movements: ____________ Start time: ____________ Finish time: ____________ Date: ____________ Movements: ____________ Start time: ____________ Finish time: ____________ Date: ____________ Movements: ____________ Start time: ____________ Finish time: ____________ Date: ____________ Movements: ____________ Start time: ____________ Finish time: ____________  Date: ____________ Movements: ____________ Start time: ____________ Finish time: ____________ Date: ____________ Movements: ____________ Start time: ____________ Finish time: ____________ Date: ____________ Movements: ____________ Start time: ____________ Finish time: ____________ Date: ____________ Movements: ____________ Start time: ____________ Finish time: ____________ Date: ____________  Movements: ____________ Start time: ____________ Finish time: ____________ Date: ____________ Movements: ____________ Start time: ____________ Finish time: ____________ Date: ____________ Movements: ____________ Start time: ____________ Finish time: ____________  Date: ____________ Movements: ____________ Start time: ____________ Finish time: ____________ Date: ____________ Movements: ____________ Start time: ____________ Finish time: ____________ Date: ____________ Movements: ____________ Start time: ____________ Finish time: ____________ Date: ____________ Movements: ____________ Start time: ____________ Finish time: ____________ Date: ____________ Movements: ____________ Start time: ____________ Finish time: ____________ Date: ____________ Movements: ____________ Start time: ____________ Finish time: ____________ Date: ____________ Movements: ____________ Start time: ____________ Finish time: ____________  Date: ____________ Movements: ____________ Start time: ____________ Finish time: ____________ Date: ____________ Movements: ____________ Start time: ____________ Finish time: ____________ Date: ____________ Movements: ____________ Start time: ____________ Finish time: ____________ Date: ____________ Movements: ____________ Start time: ____________ Finish time: ____________ Date: ____________ Movements: ____________ Start time: ____________ Finish time: ____________ Date: ____________ Movements: ____________ Start time: ____________ Finish time: ____________ Date: ____________ Movements: ____________ Start time: ____________ Finish time: ____________  Date: ____________ Movements: ____________ Start time: ____________ Finish time: ____________ Date: ____________ Movements: ____________ Start time: ____________ Finish time: ____________ Date: ____________ Movements: ____________ Start time: ____________ Finish time: ____________ Date: ____________ Movements: ____________ Start time:  ____________ Finish time: ____________ Date: ____________ Movements: ____________ Start time: ____________ Finish time: ____________ Date: ____________ Movements: ____________ Start time: ____________ Finish time: ____________ Date: ____________ Movements: ____________ Start time: ____________ Finish time: ____________  Date: ____________ Movements: ____________ Start time: ____________ Finish time: ____________ Date: ____________ Movements: ____________ Start   time: ____________ Finish time: ____________ Date: ____________ Movements: ____________ Start time: ____________ Finish time: ____________ Date: ____________ Movements: ____________ Start time: ____________ Finish time: ____________ Date: ____________ Movements: ____________ Start time: ____________ Finish time: ____________ Date: ____________ Movements: ____________ Start time: ____________ Finish time: ____________ Date: ____________ Movements: ____________ Start time: ____________ Finish time: ____________  Date: ____________ Movements: ____________ Start time: ____________ Finish time: ____________ Date: ____________ Movements: ____________ Start time: ____________ Finish time: ____________ Date: ____________ Movements: ____________ Start time: ____________ Finish time: ____________ Date: ____________ Movements: ____________ Start time: ____________ Finish time: ____________ Date: ____________ Movements: ____________ Start time: ____________ Finish time: ____________ Date: ____________ Movements: ____________ Start time: ____________ Finish time: ____________ Date: ____________ Movements: ____________ Start time: ____________ Finish time: ____________  Date: ____________ Movements: ____________ Start time: ____________ Finish time: ____________ Date: ____________ Movements: ____________ Start time: ____________ Finish time: ____________ Date: ____________ Movements: ____________ Start time: ____________ Finish time: ____________ Date:  ____________ Movements: ____________ Start time: ____________ Finish time: ____________ Date: ____________ Movements: ____________ Start time: ____________ Finish time: ____________ Date: ____________ Movements: ____________ Start time: ____________ Finish time: ____________ Document Released: 09/18/2006 Document Revised: 08/05/2012 Document Reviewed: 06/15/2012 [employer] Patient Information 2014 [employer].  

## 2013-12-24 NOTE — Progress Notes (Signed)
FHT from last pm reviewed.  Reactive NST, no significant decels, irregular ctx.

## 2013-12-29 ENCOUNTER — Inpatient Hospital Stay (HOSPITAL_COMMUNITY): Payer: Medicaid Other

## 2013-12-29 ENCOUNTER — Inpatient Hospital Stay (HOSPITAL_COMMUNITY): Payer: Medicaid Other | Admitting: Anesthesiology

## 2013-12-29 ENCOUNTER — Inpatient Hospital Stay (HOSPITAL_COMMUNITY)
Admission: AD | Admit: 2013-12-29 | Discharge: 2014-01-01 | DRG: 767 | Disposition: A | Discharge status: Home / Self Care | Payer: Medicaid Other | Source: Ambulatory Visit | Attending: Obstetrics and Gynecology | Admitting: Obstetrics and Gynecology

## 2013-12-29 ENCOUNTER — Encounter (HOSPITAL_COMMUNITY): Payer: Medicaid Other | Admitting: Anesthesiology

## 2013-12-29 ENCOUNTER — Encounter (HOSPITAL_COMMUNITY): Payer: Self-pay

## 2013-12-29 DIAGNOSIS — O409XX0 Polyhydramnios, unspecified trimester, not applicable or unspecified: Principal | ICD-10-CM | POA: Diagnosis present

## 2013-12-29 DIAGNOSIS — IMO0001 Reserved for inherently not codable concepts without codable children: Secondary | ICD-10-CM

## 2013-12-29 DIAGNOSIS — J019 Acute sinusitis, unspecified: Secondary | ICD-10-CM

## 2013-12-29 DIAGNOSIS — O99214 Obesity complicating childbirth: Secondary | ICD-10-CM

## 2013-12-29 DIAGNOSIS — E669 Obesity, unspecified: Secondary | ICD-10-CM | POA: Diagnosis present

## 2013-12-29 DIAGNOSIS — Z87891 Personal history of nicotine dependence: Secondary | ICD-10-CM

## 2013-12-29 DIAGNOSIS — Z302 Encounter for sterilization: Secondary | ICD-10-CM

## 2013-12-29 DIAGNOSIS — Z8249 Family history of ischemic heart disease and other diseases of the circulatory system: Secondary | ICD-10-CM

## 2013-12-29 DIAGNOSIS — Z6841 Body Mass Index (BMI) 40.0 and over, adult: Secondary | ICD-10-CM

## 2013-12-29 DIAGNOSIS — Z833 Family history of diabetes mellitus: Secondary | ICD-10-CM

## 2013-12-29 DIAGNOSIS — O481 Prolonged pregnancy: Secondary | ICD-10-CM | POA: Diagnosis present

## 2013-12-29 DIAGNOSIS — Z823 Family history of stroke: Secondary | ICD-10-CM

## 2013-12-29 LAB — CBC
HEMATOCRIT: 35.9 % — AB (ref 36.0–46.0)
Hemoglobin: 12.3 g/dL (ref 12.0–15.0)
MCH: 31 pg (ref 26.0–34.0)
MCHC: 34.3 g/dL (ref 30.0–36.0)
MCV: 90.4 fL (ref 78.0–100.0)
PLATELETS: 253 10*3/uL (ref 150–400)
RBC: 3.97 MIL/uL (ref 3.87–5.11)
RDW: 14.6 % (ref 11.5–15.5)
WBC: 13.6 10*3/uL — AB (ref 4.0–10.5)

## 2013-12-29 MED ORDER — LACTATED RINGERS IV SOLN
INTRAVENOUS | Status: DC
  Administered 2013-12-30 (×2): 125 mL/h via INTRAVENOUS

## 2013-12-29 MED ORDER — OXYTOCIN 40 UNITS IN LACTATED RINGERS INFUSION - SIMPLE MED
62.5000 mL/h | INTRAVENOUS | Status: DC
  Filled 2013-12-29: qty 1000

## 2013-12-29 MED ORDER — IBUPROFEN 600 MG PO TABS: 600.0000 mg | ORAL_TABLET | Freq: Four times a day (QID) | ORAL | Status: DC | PRN

## 2013-12-29 MED ORDER — FENTANYL 2.5 MCG/ML BUPIVACAINE 1/10 % EPIDURAL INFUSION (WH - ANES)
14.0000 mL/h | INTRAMUSCULAR | Status: DC | PRN
  Administered 2013-12-30: 14 mL/h via EPIDURAL
  Filled 2013-12-29 (×2): qty 125

## 2013-12-29 MED ORDER — PHENYLEPHRINE 40 MCG/ML (10ML) SYRINGE FOR IV PUSH (FOR BLOOD PRESSURE SUPPORT): 80.0000 ug | PREFILLED_SYRINGE | INTRAVENOUS | Status: DC | PRN

## 2013-12-29 MED ORDER — CITRIC ACID-SODIUM CITRATE 334-500 MG/5ML PO SOLN: 30.0000 mL | ORAL | Status: DC | PRN

## 2013-12-29 MED ORDER — EPHEDRINE 5 MG/ML INJ
10.0000 mg | INTRAVENOUS | Status: DC | PRN
  Filled 2013-12-29: qty 4

## 2013-12-29 MED ORDER — DIPHENHYDRAMINE HCL 50 MG/ML IJ SOLN: 12.5000 mg | INTRAMUSCULAR | Status: DC | PRN

## 2013-12-29 MED ORDER — ONDANSETRON HCL 4 MG/2ML IJ SOLN
4.0000 mg | Freq: Four times a day (QID) | INTRAMUSCULAR | Status: DC | PRN
Start: 2013-12-29 — End: 2013-12-30

## 2013-12-29 MED ORDER — EPHEDRINE 5 MG/ML INJ
10.0000 mg | INTRAVENOUS | Status: DC | PRN
  Administered 2013-12-30: 10 mg via INTRAVENOUS

## 2013-12-29 MED ORDER — ACETAMINOPHEN 325 MG PO TABS
650.0000 mg | ORAL_TABLET | ORAL | Status: DC | PRN
  Administered 2013-12-30: 650 mg via ORAL
  Filled 2013-12-29: qty 2

## 2013-12-29 MED ORDER — OXYTOCIN BOLUS FROM INFUSION: 500.0000 mL | INTRAVENOUS | Status: DC

## 2013-12-29 MED ORDER — LACTATED RINGERS IV SOLN: 500.0000 mL | Freq: Once | INTRAVENOUS | Status: DC

## 2013-12-29 MED ORDER — LIDOCAINE HCL (PF) 1 % IJ SOLN: 30.0000 mL | INTRAMUSCULAR | Status: DC | PRN

## 2013-12-29 MED ORDER — OXYCODONE-ACETAMINOPHEN 5-325 MG PO TABS: 1.0000 | ORAL_TABLET | ORAL | Status: DC | PRN

## 2013-12-29 MED ORDER — LACTATED RINGERS IV SOLN
500.0000 mL | INTRAVENOUS | Status: DC | PRN
  Administered 2013-12-29: 500 mL via INTRAVENOUS
  Administered 2013-12-30: 300 mL via INTRAVENOUS

## 2013-12-29 MED ORDER — PHENYLEPHRINE 40 MCG/ML (10ML) SYRINGE FOR IV PUSH (FOR BLOOD PRESSURE SUPPORT)
80.0000 ug | PREFILLED_SYRINGE | INTRAVENOUS | Status: DC | PRN
  Filled 2013-12-29: qty 10

## 2013-12-29 NOTE — Procedures (Addendum)
Operative Note  Preoperative diagnosis Desires permanent sterility  Postoperative diagnosis Same  Procedure Bilateral tubal ligation (with distal bilateral salpingectomy)  Surgeon Dr. Huel CoteKathy Alvia Tory  Anesthesia Epidural  Fluids Estimated blood loss minimal Urine output greater than 300 cc IV fluids 1200 cc LR  Findings Uterus tubes and ovaries appeared normal. The distal third of each fallopian tube was transected and removed.  Pathology Distal fallopian tube segments were sent  Procedure Patient was taken to the operating room where epidural anesthesia was boosted and found to be adequate by Allis clamp test. The patient was then prepped and draped in the normal sterile fashion in the dorsal supine position with a Foley catheter in place. An appropriate time out was performed. The infraumbilical area was injected with quarter percent Marcaine plain and elevated in 2 Allis clamps. A 2-3 cm incision was made with the scalpel and carried through to the underlying layer of fascia with Mayo scissors. The peritoneal cavity itself was entered bluntly. The left fallopian tube was then identified grasped a Babcock clamp and traced out to its fimbriated end. The distal third of the tube was then elevated and cross clamped with a Kelly clamp. The tube was then amputated. A suture ligature of 2-0 Vicryl was then utilized x2 to suture the pedicle.  Attention was then turned to the right fallopian tube which was likewise grasped back up Babcock clamp and traced out to its fimbriated end. The distal third was then elevated and cross clamped with a Kelly clamp. It was then amputated and the free pedicle likewise secured with 2 suture ligatures of 2-0 Vicryl. Both pedicles were inspected and found to be hemostatic. All instruments and sponges were then removed from the patient's abdomen. The patient's fascia was then grasped with Coker clamps and elevated well away from the underlying structures. A 0  Vicryl was then utilized to close the fascia in a running fashion. Subcutaneous tissue was reapproximated with an interrupted suture of 2-0 Vicryl. The skin was then closed with 3-0 Vicryl in a subcuticular stitch and Dermabond. Once again all counts were correct and the patient was taken to the recovery room in good condition.

## 2013-12-29 NOTE — Anesthesia Preprocedure Evaluation (Signed)
Anesthesia Evaluation  Patient identified by MRN, date of birth, ID band Patient awake    Reviewed: Allergy & Precautions, H&P , NPO status , Patient's Chart, lab work & pertinent test results  Airway Mallampati: III TM Distance: >3 FB Neck ROM: full    Dental no notable dental hx.    Pulmonary neg pulmonary ROS, former smoker,    Pulmonary exam normal       Cardiovascular negative cardio ROS      Neuro/Psych negative neurological ROS  negative psych ROS   GI/Hepatic negative GI ROS, Neg liver ROS,   Endo/Other  Morbid obesity  Renal/GU negative Renal ROS  negative genitourinary   Musculoskeletal negative musculoskeletal ROS (+)   Abdominal (+) + obese,   Peds negative pediatric ROS (+)  Hematology negative hematology ROS (+)   Anesthesia Other Findings   Reproductive/Obstetrics (+) Pregnancy                           Anesthesia Physical  Anesthesia Plan  ASA: III  Anesthesia Plan: Epidural   Post-op Pain Management:    Induction:   Airway Management Planned:   Additional Equipment:   Intra-op Plan:   Post-operative Plan:   Informed Consent: I have reviewed the patients History and Physical, chart, labs and discussed the procedure including the risks, benefits and alternatives for the proposed anesthesia with the patient or authorized representative who has indicated his/her understanding and acceptance.     Plan Discussed with:   Anesthesia Plan Comments:         Anesthesia Quick Evaluation

## 2013-12-29 NOTE — MAU Note (Signed)
Pt c/o uc since last night anywhere from 2-5 mins. Denies LOF or vag bleeding. +FM. States she was 2.5cm last week

## 2013-12-30 ENCOUNTER — Inpatient Hospital Stay (HOSPITAL_COMMUNITY): Payer: Medicaid Other

## 2013-12-30 ENCOUNTER — Encounter (HOSPITAL_COMMUNITY): Payer: Self-pay | Admitting: *Deleted

## 2013-12-30 ENCOUNTER — Encounter (HOSPITAL_COMMUNITY): Payer: Medicaid Other

## 2013-12-30 ENCOUNTER — Encounter (HOSPITAL_COMMUNITY)
Admission: AD | Disposition: A | Discharge status: Home / Self Care | Payer: Self-pay | Source: Ambulatory Visit | Attending: Obstetrics and Gynecology

## 2013-12-30 DIAGNOSIS — Z6841 Body Mass Index (BMI) 40.0 and over, adult: Secondary | ICD-10-CM | POA: Diagnosis not present

## 2013-12-30 DIAGNOSIS — O481 Prolonged pregnancy: Secondary | ICD-10-CM | POA: Diagnosis not present

## 2013-12-30 DIAGNOSIS — O409XX0 Polyhydramnios, unspecified trimester, not applicable or unspecified: Secondary | ICD-10-CM | POA: Diagnosis not present

## 2013-12-30 HISTORY — PX: TUBAL LIGATION: SHX77

## 2013-12-30 LAB — TYPE AND SCREEN
ABO/RH(D): A POS
Antibody Screen: NEGATIVE

## 2013-12-30 LAB — SURGICAL PCR SCREEN
MRSA, PCR: NEGATIVE
Staphylococcus aureus: NEGATIVE

## 2013-12-30 LAB — RPR

## 2013-12-30 SURGERY — LIGATION, FALLOPIAN TUBE, POSTPARTUM
Anesthesia: Epidural | Laterality: Bilateral

## 2013-12-30 MED ORDER — SIMETHICONE 80 MG PO CHEW: 80.0000 mg | CHEWABLE_TABLET | ORAL | Status: DC | PRN

## 2013-12-30 MED ORDER — OXYCODONE-ACETAMINOPHEN 5-325 MG PO TABS
1.0000 | ORAL_TABLET | ORAL | Status: DC | PRN
  Administered 2013-12-30 – 2013-12-31 (×2): 1 via ORAL
  Filled 2013-12-30 (×2): qty 1

## 2013-12-30 MED ORDER — METOCLOPRAMIDE HCL 5 MG/ML IJ SOLN: 10.0000 mg | Freq: Once | INTRAMUSCULAR | Status: AC | PRN

## 2013-12-30 MED ORDER — BUPIVACAINE HCL (PF) 0.25 % IJ SOLN
INTRAMUSCULAR | Status: DC | PRN
  Administered 2013-12-30: 10 mL

## 2013-12-30 MED ORDER — ONDANSETRON HCL 4 MG/2ML IJ SOLN: 4.0000 mg | INTRAMUSCULAR | Status: DC | PRN

## 2013-12-30 MED ORDER — FENTANYL 2.5 MCG/ML BUPIVACAINE 1/10 % EPIDURAL INFUSION (WH - ANES)
INTRAMUSCULAR | Status: DC | PRN
  Administered 2013-12-29: 14 mL/h via EPIDURAL

## 2013-12-30 MED ORDER — MIDAZOLAM HCL 5 MG/5ML IJ SOLN
INTRAMUSCULAR | Status: DC | PRN
  Administered 2013-12-30: 2 mg via INTRAVENOUS

## 2013-12-30 MED ORDER — BENZOCAINE-MENTHOL 20-0.5 % EX AERO
1.0000 "application " | INHALATION_SPRAY | CUTANEOUS | Status: DC | PRN
  Filled 2013-12-30: qty 56

## 2013-12-30 MED ORDER — WITCH HAZEL-GLYCERIN EX PADS: 1.0000 "application " | MEDICATED_PAD | CUTANEOUS | Status: DC | PRN

## 2013-12-30 MED ORDER — LACTATED RINGERS IV SOLN: INTRAVENOUS | Status: DC

## 2013-12-30 MED ORDER — FENTANYL CITRATE 0.05 MG/ML IJ SOLN
INTRAMUSCULAR | Status: AC
  Administered 2013-12-30: 50 ug via INTRAVENOUS
  Filled 2013-12-30: qty 2

## 2013-12-30 MED ORDER — SODIUM BICARBONATE 8.4 % IV SOLN
INTRAVENOUS | Status: AC
  Filled 2013-12-30: qty 50

## 2013-12-30 MED ORDER — 0.9 % SODIUM CHLORIDE (POUR BTL) OPTIME
TOPICAL | Status: DC | PRN
  Administered 2013-12-30: 1000 mL

## 2013-12-30 MED ORDER — ZOLPIDEM TARTRATE 5 MG PO TABS: 5.0000 mg | ORAL_TABLET | Freq: Every evening | ORAL | Status: DC | PRN

## 2013-12-30 MED ORDER — TERBUTALINE SULFATE 1 MG/ML IJ SOLN: 0.2500 mg | Freq: Once | INTRAMUSCULAR | Status: DC | PRN

## 2013-12-30 MED ORDER — OXYTOCIN 40 UNITS IN LACTATED RINGERS INFUSION - SIMPLE MED
1.0000 m[IU]/min | INTRAVENOUS | Status: DC
  Administered 2013-12-30: 2 m[IU]/min via INTRAVENOUS

## 2013-12-30 MED ORDER — LANOLIN HYDROUS EX OINT: TOPICAL_OINTMENT | CUTANEOUS | Status: DC | PRN

## 2013-12-30 MED ORDER — SODIUM BICARBONATE 8.4 % IV SOLN
INTRAVENOUS | Status: DC | PRN
  Administered 2013-12-30 (×2): 5 mL via EPIDURAL
  Administered 2013-12-30: 10 mL via EPIDURAL

## 2013-12-30 MED ORDER — TETANUS-DIPHTH-ACELL PERTUSSIS 5-2.5-18.5 LF-MCG/0.5 IM SUSP
0.5000 mL | Freq: Once | INTRAMUSCULAR | Status: DC
Start: 2013-12-31 — End: 2014-01-01

## 2013-12-30 MED ORDER — ONDANSETRON HCL 4 MG PO TABS: 4.0000 mg | ORAL_TABLET | ORAL | Status: DC | PRN

## 2013-12-30 MED ORDER — DIBUCAINE 1 % RE OINT
1.0000 "application " | TOPICAL_OINTMENT | RECTAL | Status: DC | PRN
  Filled 2013-12-30: qty 28

## 2013-12-30 MED ORDER — LIDOCAINE HCL (PF) 1 % IJ SOLN
INTRAMUSCULAR | Status: DC | PRN
  Administered 2013-12-29 (×2): 4 mL

## 2013-12-30 MED ORDER — LACTATED RINGERS IV SOLN
INTRAVENOUS | Status: DC | PRN
  Administered 2013-12-30: 17:00:00 via INTRAVENOUS

## 2013-12-30 MED ORDER — SENNOSIDES-DOCUSATE SODIUM 8.6-50 MG PO TABS
2.0000 | ORAL_TABLET | ORAL | Status: DC
  Administered 2013-12-31 – 2014-01-01 (×2): 2 via ORAL
  Filled 2013-12-30 (×2): qty 2

## 2013-12-30 MED ORDER — IBUPROFEN 600 MG PO TABS
600.0000 mg | ORAL_TABLET | Freq: Four times a day (QID) | ORAL | Status: DC
  Administered 2013-12-30 – 2014-01-01 (×7): 600 mg via ORAL
  Filled 2013-12-30 (×7): qty 1

## 2013-12-30 MED ORDER — BUPIVACAINE HCL (PF) 0.25 % IJ SOLN
INTRAMUSCULAR | Status: AC
  Filled 2013-12-30: qty 30

## 2013-12-30 MED ORDER — FENTANYL CITRATE 0.05 MG/ML IJ SOLN
INTRAMUSCULAR | Status: AC
  Filled 2013-12-30: qty 2

## 2013-12-30 MED ORDER — PRENATAL MULTIVITAMIN CH
1.0000 | ORAL_TABLET | Freq: Every day | ORAL | Status: DC
  Administered 2013-12-31: 1 via ORAL
  Filled 2013-12-30: qty 1

## 2013-12-30 MED ORDER — FENTANYL CITRATE 0.05 MG/ML IJ SOLN
25.0000 ug | INTRAMUSCULAR | Status: DC | PRN
  Administered 2013-12-30 (×2): 50 ug via INTRAVENOUS

## 2013-12-30 MED ORDER — MEPERIDINE HCL 25 MG/ML IJ SOLN: 6.2500 mg | INTRAMUSCULAR | Status: DC | PRN

## 2013-12-30 MED ORDER — METOCLOPRAMIDE HCL 10 MG PO TABS
10.0000 mg | ORAL_TABLET | Freq: Once | ORAL | Status: AC
  Administered 2013-12-30: 10 mg via ORAL
  Filled 2013-12-30: qty 1

## 2013-12-30 MED ORDER — LIDOCAINE-EPINEPHRINE (PF) 2 %-1:200000 IJ SOLN
INTRAMUSCULAR | Status: AC
  Filled 2013-12-30: qty 20

## 2013-12-30 MED ORDER — FENTANYL CITRATE 0.05 MG/ML IJ SOLN
INTRAMUSCULAR | Status: DC | PRN
  Administered 2013-12-30: 100 ug via INTRAVENOUS

## 2013-12-30 MED ORDER — DIPHENHYDRAMINE HCL 25 MG PO CAPS: 25.0000 mg | ORAL_CAPSULE | Freq: Four times a day (QID) | ORAL | Status: DC | PRN

## 2013-12-30 MED ORDER — FAMOTIDINE 20 MG PO TABS
40.0000 mg | ORAL_TABLET | Freq: Once | ORAL | Status: AC
  Administered 2013-12-30: 40 mg via ORAL
  Filled 2013-12-30: qty 2

## 2013-12-30 MED ORDER — MIDAZOLAM HCL 2 MG/2ML IJ SOLN
INTRAMUSCULAR | Status: AC
  Filled 2013-12-30: qty 2

## 2013-12-30 SURGICAL SUPPLY — 24 items
CHLORAPREP W/TINT 26ML (MISCELLANEOUS) ×6 IMPLANT
CLOTH BEACON ORANGE TIMEOUT ST (SAFETY) ×3 IMPLANT
CONTAINER PREFILL 10% NBF 15ML (MISCELLANEOUS) ×6 IMPLANT
DERMABOND ADHESIVE PROPEN (GAUZE/BANDAGES/DRESSINGS) ×2
DERMABOND ADVANCED .7 DNX6 (GAUZE/BANDAGES/DRESSINGS) ×1 IMPLANT
GLOVE BIO SURGEON STRL SZ 6.5 (GLOVE) ×2 IMPLANT
GLOVE BIO SURGEONS STRL SZ 6.5 (GLOVE) ×1
GOWN STRL REUS W/TWL LRG LVL3 (GOWN DISPOSABLE) ×6 IMPLANT
NEEDLE HYPO 25X1 1.5 SAFETY (NEEDLE) ×3 IMPLANT
NS IRRIG 1000ML POUR BTL (IV SOLUTION) ×3 IMPLANT
PACK ABDOMINAL MINOR (CUSTOM PROCEDURE TRAY) ×3 IMPLANT
SPONGE LAP 4X18 X RAY DECT (DISPOSABLE) IMPLANT
SUT GUT PLAIN 0 CT-3 TAN 27 (SUTURE) IMPLANT
SUT PLAIN 0 NONE (SUTURE) ×3 IMPLANT
SUT VIC AB 0 CT1 27 (SUTURE) ×4
SUT VIC AB 0 CT1 27XBRD ANBCTR (SUTURE) ×2 IMPLANT
SUT VIC AB 2-0 CT1 27 (SUTURE) ×2
SUT VIC AB 2-0 CT1 TAPERPNT 27 (SUTURE) ×1 IMPLANT
SUT VIC AB 3-0 PS2 18 (SUTURE) ×2
SUT VIC AB 3-0 PS2 18XBRD (SUTURE) ×1 IMPLANT
SYR CONTROL 10ML LL (SYRINGE) ×3 IMPLANT
TOWEL OR 17X24 6PK STRL BLUE (TOWEL DISPOSABLE) ×6 IMPLANT
TRAY FOLEY CATH 14FR (SET/KITS/TRAYS/PACK) ×3 IMPLANT
WATER STERILE IRR 1000ML POUR (IV SOLUTION) ×3 IMPLANT

## 2013-12-30 NOTE — Plan of Care (Signed)
Problem: Phase II Progression Outcomes Goal: Incision intact & without signs/symptoms of infection Outcome: Progressing Pt had BTL today at 1730 with skin glu covering incision

## 2013-12-30 NOTE — Anesthesia Postprocedure Evaluation (Signed)
  Anesthesia Post-op Note  Anesthesia Post Note  Patient: Gail Browning  Procedure(s) Performed: Procedure(s) (LRB): POST PARTUM TUBAL LIGATION (Bilateral)  Anesthesia type: Epidural  Patient location: PACU  Post pain: Pain level controlled  Post assessment: Post-op Vital signs reviewed  Last Vitals:  Filed Vitals:   12/30/13 1900  BP: 115/64  Pulse: 69  Temp:   Resp: 25    Post vital signs: stable  Level of consciousness: awake  Complications: No apparent anesthesia complications

## 2013-12-30 NOTE — Transfer of Care (Signed)
Immediate Anesthesia Transfer of Care Note  Patient: Gail Browning  Procedure(s) Performed: Procedure(s): POST PARTUM TUBAL LIGATION (Bilateral)  Patient Location: PACU  Anesthesia Type:Epidural  Level of Consciousness: awake, alert  and oriented  Airway & Oxygen Therapy: Patient Spontanous Breathing  Post-op Assessment: Report given to PACU RN and Post -op Vital signs reviewed and stable  Post vital signs: Reviewed and stable  Complications: No apparent anesthesia complications

## 2013-12-30 NOTE — Anesthesia Postprocedure Evaluation (Signed)
  Anesthesia Post-op Note  Patient: Gail Browning  Procedure(s) Performed: * No procedures listed *  Patient Location: Mother/Baby  Anesthesia Type:Epidural  Level of Consciousness: awake, alert , oriented and patient cooperative  Airway and Oxygen Therapy: Patient Spontanous Breathing  Post-op Pain: mild  Post-op Assessment: Patient's Cardiovascular Status Stable, Respiratory Function Stable, No headache, No backache, No residual numbness and No residual motor weakness  Post-op Vital Signs: stable  Last Vitals:  Filed Vitals:   12/30/13 1310  BP: 114/70  Pulse: 69  Temp: 37.4 C  Resp: 18    Complications: No apparent anesthesia complications

## 2013-12-30 NOTE — Anesthesia Preprocedure Evaluation (Signed)
Anesthesia Evaluation  Patient identified by MRN, date of birth, ID band Patient awake    Reviewed: Allergy & Precautions, H&P , NPO status , Patient's Chart, lab work & pertinent test results  Airway Mallampati: III TM Distance: >3 FB Neck ROM: full    Dental no notable dental hx.    Pulmonary neg pulmonary ROS, former smoker,    Pulmonary exam normal       Cardiovascular negative cardio ROS      Neuro/Psych negative neurological ROS  negative psych ROS   GI/Hepatic negative GI ROS, Neg liver ROS,   Endo/Other  Morbid obesity  Renal/GU negative Renal ROS  negative genitourinary   Musculoskeletal negative musculoskeletal ROS (+)   Abdominal (+) + obese,   Peds negative pediatric ROS (+)  Hematology negative hematology ROS (+)   Anesthesia Other Findings   Reproductive/Obstetrics Desires sterilization                           Anesthesia Physical  Anesthesia Plan  ASA: III  Anesthesia Plan: Epidural   Post-op Pain Management:    Induction:   Airway Management Planned:   Additional Equipment:   Intra-op Plan:   Post-operative Plan:   Informed Consent: I have reviewed the patients History and Physical, chart, labs and discussed the procedure including the risks, benefits and alternatives for the proposed anesthesia with the patient or authorized representative who has indicated his/her understanding and acceptance.     Plan Discussed with:   Anesthesia Plan Comments:         Anesthesia Quick Evaluation

## 2013-12-30 NOTE — Anesthesia Procedure Notes (Signed)
Epidural Patient location during procedure: OB Start time: 12/30/2013 11:30 PM End time: 12/30/2013 11:40 PM  Staffing Anesthesiologist: Lewie LoronGERMEROTH, Kenyada Hy R Performed by: anesthesiologist   Preanesthetic Checklist Completed: patient identified, pre-op evaluation, timeout performed, IV checked, risks and benefits discussed and monitors and equipment checked  Epidural Patient position: sitting Prep: site prepped and draped and DuraPrep Patient monitoring: heart rate Approach: midline Location: L2-L3 Injection technique: LOR air and LOR saline  Needle:  Needle type: Tuohy  Needle gauge: 17 G Needle length: 9 cm Needle insertion depth: 7 cm Catheter type: closed end flexible Catheter size: 19 Gauge Catheter at skin depth: 13 cm Test dose: negative  Assessment Sensory level: T8 Events: blood not aspirated, injection not painful, no injection resistance, negative IV test and no paresthesia  Additional Notes Reason for block:procedure for pain

## 2013-12-30 NOTE — H&P (Signed)
Gail Browning is a 28 y.o. female, G2 P1001, EGA 37+ weeks with EDC 5-15 presenting for eval of possible ctx.  In MAU late last pm, having regular ctx, initial VE 2-3 cm.  Had some variable decels, BPP 8/8, VE changed to 5 cm so pt admitted.  She has progressed to 6 cm and received an epidural.  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  See prenatal records for complete history.  Maternal Medical History:  Reason for admission: Contractions.   Contractions: Frequency: regular.   Perceived severity is moderate.    Fetal activity: Perceived fetal activity is normal.    Prenatal complications: Polyhydramnios.     OB History   Grav Para Term Preterm Abortions TAB SAB Ect Mult Living   2 1 1  0 0 0 0 0 0 1    SVD at term, 7 lbs 12 oz, moderate shoulder dystocia  Past Medical History  Diagnosis Date  . SINUSITIS, ACUTE 10/09/2009  . Chicken pox   . PCOS (polycystic ovarian syndrome)   . Hx of chlamydia infection   . Abnormal Pap smear   . Obese   . Endocrine disorder     high testosterone. treated with metformin  . 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    Past Surgical History  Procedure Laterality Date  . No past surgeries     Family History: family history includes Arthritis in her other; COPD in her father; Cancer in her father and other; Diabetes in her maternal grandfather; Hyperlipidemia in her other; Hypertension in her father, maternal grandmother, and other; Seizures in her father; Stroke in her other. Social History:  reports that she quit smoking about 2 years ago. Her smoking use included Cigarettes. She has a 1.5 pack-year smoking history. She has never used smokeless tobacco. She reports that she does not drink alcohol or use illicit drugs.   Prenatal Transfer Tool  Maternal Diabetes: No Genetic Screening: Declined Maternal Ultrasounds/Referrals: Normal Fetal  Ultrasounds or other Referrals:  None Maternal Substance Abuse:  No Significant Maternal Medications:  None Significant Maternal Lab Results:  Lab values include: Group B Strep negative Other Comments:  polyhydramnios  Review of Systems  Respiratory: Negative.   Cardiovascular: Negative.    U/s confirms vtx AROM clear Dilation: 6 Effacement (%): 100 Station: -2 Exam by:: T. Sprague RN Blood pressure 108/53, pulse 100, temperature 98.2 F (36.8 C), temperature source Oral, resp. rate 16, height 5\' 4"  (1.626 m), weight 119.296 kg (263 lb), SpO2 90.00%. Maternal Exam:  Uterine Assessment: Contraction strength is moderate.  Contraction frequency is regular.   Abdomen: Patient reports no abdominal tenderness. Estimated fetal weight is 7 1/2 lbs.   Fetal presentation: vertex  Introitus: Normal vulva. Normal vagina.  Amniotic fluid character: clear.  Pelvis: adequate for delivery.   Cervix: Cervix evaluated by digital exam.     Fetal Exam Fetal Monitor Review: Mode: ultrasound.   Baseline rate: 130.  Variability: moderate (6-25 bpm).   Pattern: accelerations present and variable decelerations.    Fetal State Assessment: Category II - tracings are indeterminate.     Physical Exam  Constitutional: She appears well-developed and well-nourished.  Cardiovascular: Normal rate, regular rhythm and normal heart sounds.   No murmur heard. Respiratory: Effort normal and breath sounds normal. No respiratory distress. She has no wheezes.  GI: Soft.  gravid    Prenatal labs: ABO, Rh: --/--/A  POS (04/29 2250) Antibody: NEG (04/29 2250) Rubella: Immune (10/27 0000) RPR: NON REAC (04/29 2250)  HBsAg: Negative (10/27 0000)  HIV: Non-reactive (10/27 0000)  GBS: Negative (04/15 0000)  GCT:  89  Assessment/Plan: IUP at 37+ weeks in active labor, prolonged active phase most likely due to polyhydramnios.  AROM now done, will monitor progress.     Gail Browning 12/30/2013, 6:45  AM

## 2013-12-30 NOTE — Progress Notes (Signed)
Patient ID: Gail Browning, female   DOB: 01/24/1986, 28 y.o.   MRN: [id number] Pt progressed to complete dilation +1 station.  Baby OP.  Tried pushing for about 15 minutes and vertex was not moving well, so placed patient in exaggerated Sims and will see if vertex will descend FHR reassuring overall with some variable decels with pushing

## 2013-12-31 ENCOUNTER — Encounter (HOSPITAL_COMMUNITY): Payer: Self-pay | Admitting: Obstetrics and Gynecology

## 2013-12-31 LAB — CBC
HEMATOCRIT: 34.6 % — AB (ref 36.0–46.0)
Hemoglobin: 11.4 g/dL — ABNORMAL LOW (ref 12.0–15.0)
MCH: 30.2 pg (ref 26.0–34.0)
MCHC: 32.9 g/dL (ref 30.0–36.0)
MCV: 91.8 fL (ref 78.0–100.0)
PLATELETS: 223 10*3/uL (ref 150–400)
RBC: 3.77 MIL/uL — ABNORMAL LOW (ref 3.87–5.11)
RDW: 15 % (ref 11.5–15.5)
WBC: 13.7 10*3/uL — AB (ref 4.0–10.5)

## 2013-12-31 NOTE — Addendum Note (Signed)
Addendum created 12/31/13 0801 by Suella Groveoderick C Latonga Ponder, CRNA   Modules edited: Notes Section   Notes Section:  File: [id number]

## 2013-12-31 NOTE — Progress Notes (Signed)
UR chart review completed.  

## 2013-12-31 NOTE — Addendum Note (Signed)
Addendum created 12/31/13 0701 by Shanon PayorSuzanne M Ziare Orrick, CRNA   Modules edited: SmartForms   SmartForms:  VN Section for SmartForms 146

## 2013-12-31 NOTE — Anesthesia Postprocedure Evaluation (Signed)
  Anesthesia Post-op Note  Patient: Gail Browning  Procedure(s) Performed: Procedure(s): POST PARTUM TUBAL LIGATION (Bilateral)  Patient Location: Mother/Baby  Anesthesia Type:Epidural  Level of Consciousness: awake  Airway and Oxygen Therapy: Patient Spontanous Breathing  Post-op Pain: none  Post-op Assessment: Post-op Vital signs reviewed, Patient's Cardiovascular Status Stable, Respiratory Function Stable, Patent Airway, No signs of Nausea or vomiting, Adequate PO intake, Pain level controlled, No headache, No backache, No residual numbness and No residual motor weakness  Post-op Vital Signs: Reviewed and stable  Last Vitals:  Filed Vitals:   12/31/13 0600  BP: 111/68  Pulse: 65  Temp: 36.4 C  Resp: 18    Complications: No apparent anesthesia complications

## 2013-12-31 NOTE — Lactation Note (Signed)
This note was copied from the chart of Gail Browning. Lactation Consultation Note Follow up visit at 34 hours of age.  Mom reports plan to breast and bottle feed and has hand pump and it worked fine for her the last time.  Mom has a history of PCOS per chart.  Encouraged mom to offer breast frequently to establish milk supply.  Mom to call RN for latch score with next feeding.  Supplemental feeding guidelines given and discussed.    Patient Name: Gail Browning ZOXWR'UToday's Date: 12/31/2013 Reason for consult: Follow-up assessment   Maternal Data    Feeding Feeding Type: Bottle Fed - Formula Length of feed: 15 min  LATCH Score/Interventions                Intervention(s): Breastfeeding basics reviewed     Lactation Tools Discussed/Used     Consult Status Consult Status: Follow-up Date: 01/01/14 Follow-up type: In-patient    Arvella MerlesJana Lynn Shoptaw 12/31/2013, 8:24 PM

## 2013-12-31 NOTE — Progress Notes (Signed)
Post Partum Day 1/POD #1 pp tubal ligation (salpingectomy) Subjective: up ad lib and tolerating PO  Objective: Blood pressure 111/68, pulse 65, temperature 97.6 F (36.4 C), temperature source Oral, resp. rate 18, height 5\' 4"  (1.626 m), weight 119.296 kg (263 lb), SpO2 97.00%, unknown if currently breastfeeding.  Physical Exam:  General: alert and cooperative Lochia: appropriate Uterine Fundus: firm Incision: well approximated, no erythema    Recent Labs  12/29/13 2250 12/31/13 0500  HGB 12.3 11.4*  HCT 35.9* 34.6*    Assessment/Plan: Plan for discharge tomorrow   LOS: 2 days   Gail Browning 12/31/2013, 9:01 AM

## 2014-01-01 MED ORDER — IBUPROFEN 600 MG PO TABS: 600.0000 mg | ORAL_TABLET | Freq: Four times a day (QID) | ORAL | Status: DC | PRN

## 2014-01-01 MED ORDER — OXYCODONE-ACETAMINOPHEN 5-325 MG PO TABS: 1.0000 | ORAL_TABLET | Freq: Four times a day (QID) | ORAL | Status: DC | PRN

## 2014-01-01 NOTE — Discharge Summary (Signed)
NAMSerita Grit:  Persons, Babbette           ACCOUNT NO.:  [id number]  MEDICAL RECORD NO.:  [id number]  LOCATION:  9124                          FACILITY:  [facility]  PHYSICIAN:  Malachi Prohomas F. Ambrose MantleHenley, M.D. DATE OF BIRTH:  Jul 31, 1986  DATE OF ADMISSION:  12/29/2013 DATE OF DISCHARGE:  01/01/2014                              DISCHARGE SUMMARY   HISTORY OF PRESENT ILLNESS:  This is a 28 year old female, para 1-0-0-1, gravida 2 at 5337+ weeks' gestation [facility], who presented for evaluation of possible contractions.  She was seen in the MAU.  Regular contractions.  Initial exam was 2-3 cm.  She had some variable decelerations.  BPP was 8/8.  Vaginal exam changed to 5 cm, so the patient was admitted.  She progressed to 6 cm and received an epidural.  Prenatal care complicated by polyhydramnios with reactive nonstress test.  History of herpes simplex virus with no current symptoms or lesions, on Valtrex.  Her blood group and type was A positive, negative antibody, rubella immune, RPR nonreactive, hepatitis B surface antigen negative, HIV negative, group B strep negative, one- hour Glucola 89.  By 9:42 a.m. on 04/30, she progressed to complete dilatation and +1 station.  The baby was OP.  The patient initially made no progress in the 2nd stage, but subsequently pushed well.  At 10:04 a.m., a viable female was delivered LOA by Dr. Senaida Oresichardson, Apgars 8 and 9 at one and five minutes.  Placenta was delivered spontaneously and intact.  There were no lacerations.  She wanted a tubal ligation, and Dr. Senaida Oresichardson did a tubal ligation on the evening of the day of delivery.  Postpartum and postoperatively, she has done well, had no significant problems, and on the second postpartum and postoperative day, she was ready for discharge.  Initial hemoglobin 12.3, hematocrit 35.9, white count 13,600.  Followup hemoglobin 11.4, platelet counts were 253 and 323.  FINAL DIAGNOSES:  Intrauterine pregnancy at 37+  weeks, delivered LOA, voluntary sterilization.  OPERATION:  Spontaneous delivery LOA, bilateral tubal ligation.  FINAL CONDITION:  Improved.  INSTRUCTIONS:  Include our regular discharge instruction booklet.  She is also given a copy of the after visit summary.  Prescriptions for Percocet 5/325, 10 tablets 1 every 6 hours as needed for pain and Motrin 600 mg 30 tablets, 1 every 6 hours as needed for pain.  She is advised to continue her prenatal vitamins and return to the office in 2 weeks for followup examination.     Malachi Prohomas F. Ambrose MantleHenley, M.D.     TFH/MEDQ  D:  01/01/2014  T:  01/01/2014  Job:  [id number]

## 2014-01-01 NOTE — Discharge Instructions (Signed)
booklet °

## 2014-01-01 NOTE — Progress Notes (Signed)
Patient ID: Gail GritKristina Browning, female   DOB: 12/27/1985, 28 y.o.   MRN: [id number] #2 afebrile BP normal. Tolerating a diet passing flatus, voiding well and ambulating. She is ready for d/c

## 2014-07-04 ENCOUNTER — Encounter (HOSPITAL_COMMUNITY): Payer: Self-pay | Admitting: Obstetrics and Gynecology

## 2014-07-08 ENCOUNTER — Encounter: Payer: Self-pay | Admitting: Family Medicine

## 2014-07-08 ENCOUNTER — Ambulatory Visit (INDEPENDENT_AMBULATORY_CARE_PROVIDER_SITE_OTHER): Payer: Self-pay | Admitting: Family Medicine

## 2014-07-08 VITALS — BP 110/80 | HR 72 | Temp 98.2°F | Wt 241.0 lb

## 2014-07-08 DIAGNOSIS — B360 Pityriasis versicolor: Secondary | ICD-10-CM

## 2014-07-08 DIAGNOSIS — M79601 Pain in right arm: Secondary | ICD-10-CM

## 2014-07-08 MED ORDER — KETOCONAZOLE 200 MG PO TABS: ORAL_TABLET | ORAL | Status: DC

## 2014-07-08 NOTE — Patient Instructions (Addendum)
Shoulder Pain: Take OTC Aleve for nerve pains.  Take up to 2 every 12 hours.  Likely to be inflammation of a nerve.     Tinea Versicolor Tinea versicolor is a common yeast infection of the skin. This condition becomes known when the yeast on our skin starts to overgrow (yeast is a normal inhabitant on our skin). This condition is noticed as white or light brown patches on brown skin, and is more evident in the summer on tanned skin. These areas are slightly scaly if scratched. The light patches from the yeast become evident when the yeast creates "holes in your suntan". This is most often noticed in the summer. The patches are usually located on the chest, back, pubis, neck and body folds. However, it may occur on any area of body. Mild itching and inflammation (redness or soreness) may be present. DIAGNOSIS  The diagnosisof this is made clinically (by looking). Cultures from samples are usually not needed. Examination under the microscope may help. However, yeast is normally found on skin. The diagnosis still remains clinical. Examination under Wood's Ultraviolet Light can determine the extent of the infection. TREATMENT  This common infection is usually only of cosmetic (only a concern to your appearance). It is easily treated with dandruff shampoo used during showers or bathing. Vigorous scrubbing will eliminate the yeast over several days time. The light areas in your skin may remain for weeks or months after the infection is cured unless your skin is exposed to sunlight. The lighter or darker spots caused by the fungus that remain after complete treatment are not a sign of treatment failure; it will take a long time to resolve. Your caregiver may recommend a number of commercial preparations or medication by mouth if home care is not working. Recurrence is common and preventative medication may be necessary. This skin condition is not highly contagious. Special care is not needed to protect close  friends and family members. Normal hygiene is usually enough. Follow up is required only if you develop complications (such as a secondary infection from scratching), if recommended by your caregiver, or if no relief is obtained from the preparations used. Document Released: 08/16/2000 Document Revised: 11/11/2011 Document Reviewed: 09/28/2008 [facility] Patient Information 2015 [place], [place]. This information is not intended to replace advice given to you by your health care provider. Make sure you discuss any questions you have with your health care provider.

## 2014-07-08 NOTE — Progress Notes (Signed)
Subjective:    Patient ID: Gail Browning, female    DOB: 06/08/1986, 28 y.o.   MRN: [id number]  HPI 28 year old female presents with acute right shoulder pain x 3 days.  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  Noticed pain and burning sensations starting paravertebrally and radiating into the shoulder and down the arm, stopping at the elbow.  Pain is made worse with certain movements.  Has noted a little numbness into the right hand.  Has tried tylenol with minimal relief.    Patient also has diffuse rash bilaterally on upper arms that consists of hypopigmentation spots.  No scaling or crusting in appearance.  Flat areas of only a few mm.  Denies pain; slightly pruritic.     Review of Systems  Constitutional: Positive for activity change. Negative for fever, appetite change and fatigue.  HENT: Negative.   Respiratory: Negative for cough, chest tightness and shortness of breath.   Cardiovascular: Negative for chest pain.  Musculoskeletal: Positive for myalgias and neck pain. Negative for back pain, joint swelling, arthralgias, gait problem and neck stiffness.  Skin: Positive for color change and rash. Negative for pallor and wound.  Neurological: Negative for dizziness, weakness, light-headedness and numbness.  Psychiatric/Behavioral: Negative for sleep disturbance.   Past Medical History  Diagnosis Date  . SINUSITIS, ACUTE 10/09/2009  . Chicken pox   . PCOS (polycystic ovarian syndrome)   . Hx of chlamydia infection   . Abnormal Pap smear   . Obese   . Endocrine disorder     high testosterone. treated with metformin  . 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    Current Outpatient Prescriptions on File Prior to Visit  Medication Sig Dispense Refill  . ibuprofen (ADVIL,MOTRIN) 600 MG tablet Take 1 tablet (600 mg total) by mouth every 6 (six) hours as needed. 30  tablet 0   No current facility-administered medications on file prior to visit.   Family History  Problem Relation Age of Onset  . Cancer Father     prostate  . Seizures Father   . Hypertension Father   . COPD Father   . Stroke Other   . Cancer Other     lung  . Hypertension Other   . Hyperlipidemia Other   . Arthritis Other   . Hypertension Maternal Grandmother   . Diabetes Maternal Grandfather    History   Social History  . Marital Status: Married    Spouse Name: N/A    Number of Children: N/A  . Years of Education: N/A   Occupational History  . Not on file.   Social History Main Topics  . Smoking status: Former Smoker -- 0.50 packs/day for 3 years    Types: Cigarettes    Quit date: 11/05/2011  . Smokeless tobacco: Never Used  . Alcohol Use: No  . Drug Use: No  . Sexual Activity: Yes   Other Topics Concern  . Not on file   Social History Narrative        Objective:   Physical Exam  Constitutional: She is oriented to person, place, and time. She appears well-developed and well-nourished. No distress.  Neck: Normal range of motion. Neck supple. No tracheal deviation present. No thyromegaly present.  Cardiovascular: Normal rate, regular rhythm, normal heart sounds and intact distal pulses.   No murmur heard. Pulmonary/Chest: Effort normal and breath  sounds normal. No respiratory distress. She has no wheezes.  Musculoskeletal: Normal range of motion. She exhibits tenderness (Tender to palpation over paravertebral area on the right and over scapula.  Burning sensation is illicited with radiation to the elbow.  No cervical point tenderness.). She exhibits no edema.  Normal passive and active ROM, grip strength, upper arm strength.  Reflexes in upper extremities symmetrical.    Neurological: She is alert and oriented to person, place, and time. She has normal reflexes. She displays normal reflexes. No cranial nerve deficit. Coordination normal.  Skin: Skin is warm  and dry. Rash (Areas of hypopigmentation bilaterally on upper arms; no scaling or crusting) noted. She is not diaphoretic. No erythema.  Nursing note and vitals reviewed.       Assessment & Plan:  1.  Shoulder pain- likely nerve origin.  Burning sensation, no decreased strength or weaknesses.  X-ray not needed at this time.  Told to try Aleve for anti-inflammatory effects.  Told to take 2 pills every 12 hours.  This should begin to resolve with time.  If pain continues or worsens, patient was told to return.  Note was also written for work giving permission to decrease duties and not be required to lift heavy amounts of weight until burning has resolved.   2.  Tinea Versicolor- bilateral areas of hypopigmentation on arms.  Nothing noted on back or lower extremities.  Slightly pruritic.  Fungal in nature.  Was started on ketoconazole 200 mg.     Luiz OchoaMelissa Charlisa Cham, PA-S   As above.  ?cervical radiculitis but nonfocal neuro exam and pain mild.  Trial of NSAID.  She has rash c/w tinea versicolor and treat with ketaconazole 400 mg one time dose.  She is aware this has a high likelihood of recurrence.  Evelena PeatBruce Burchette MD

## 2014-07-08 NOTE — Progress Notes (Signed)
Pre visit review using our clinic review tool, if applicable. No additional management support is needed unless otherwise documented below in the visit note. 

## 2014-07-25 ENCOUNTER — Ambulatory Visit (INDEPENDENT_AMBULATORY_CARE_PROVIDER_SITE_OTHER): Payer: PRIVATE HEALTH INSURANCE | Admitting: Family Medicine

## 2014-07-25 ENCOUNTER — Encounter: Payer: Self-pay | Admitting: Family Medicine

## 2014-07-25 ENCOUNTER — Telehealth: Payer: Self-pay | Admitting: Family Medicine

## 2014-07-25 VITALS — BP 124/80 | HR 83 | Temp 97.5°F | Wt 248.0 lb

## 2014-07-25 DIAGNOSIS — M546 Pain in thoracic spine: Secondary | ICD-10-CM

## 2014-07-25 DIAGNOSIS — M549 Dorsalgia, unspecified: Secondary | ICD-10-CM

## 2014-07-25 MED ORDER — CYCLOBENZAPRINE HCL 10 MG PO TABS: 10.0000 mg | ORAL_TABLET | Freq: Three times a day (TID) | ORAL | Status: DC | PRN

## 2014-07-25 NOTE — Telephone Encounter (Signed)
OK to produce note for 07-29-14 to try work with no restrictions then.

## 2014-07-25 NOTE — Progress Notes (Signed)
   Subjective:    Patient ID: Gail Browning, female    DOB: 07/13/1986, 28 y.o.   MRN: [id number]  HPI Patient here for follow-up regarding somewhat poorly localized upper back pain mostly on the right side. This is mostly periscapular region. She's had some radiation toward the left. She has been out of work past couple weeks. She states she is slightly improved. She's not describing any classic cervical radiculopathy symptoms. Possibly some occasional mild weakness right upper extremity. No major sensory impairments. Her job requires lots of lifting. She's tried heat with slight improvement in rare nonsteroidal.  Past Medical History  Diagnosis Date  . SINUSITIS, ACUTE 10/09/2009  . Chicken pox   . PCOS (polycystic ovarian syndrome)   . Hx of chlamydia infection   . Abnormal Pap smear   . Obese   . Endocrine disorder     high testosterone. treated with metformin  . 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    Past Surgical History  Procedure Laterality Date  . No past surgeries    . Tubal ligation Bilateral 12/30/2013    Procedure: POST PARTUM TUBAL LIGATION;  Surgeon: Oliver PilaKathy W Richardson, MD;  Location: [facility];  Service: Gynecology;  Laterality: Bilateral;    reports that she quit smoking about 2 years ago. Her smoking use included Cigarettes. She has a 1.5 pack-year smoking history. She has never used smokeless tobacco. She reports that she does not drink alcohol or use illicit drugs. family history includes Arthritis in her other; COPD in her father; Cancer in her father and other; Diabetes in her maternal grandfather; Hyperlipidemia in her other; Hypertension in her father, maternal grandmother, and other; Seizures in her father; Stroke in her other. No Known Allergies    Review of Systems  Constitutional: Negative for fever and chills.  Respiratory: Negative for cough and shortness of breath.   Cardiovascular: Negative for chest pain.  Neurological:  Negative for weakness and numbness.       Objective:   Physical Exam  Constitutional: She appears well-developed and well-nourished.  Cardiovascular: Normal rate and regular rhythm.   Musculoskeletal:  Full ROM right shoulder.  Good ROM cervical spine. Mild trapezius tenderness.    Neurological:  Strength full upper extremities.  Reflexes symmetric.           Assessment & Plan:  Patient has some persistent poorly localized right upper back pain. Suspect more muscular. We have recommended continued heat and over-the-counter anti-inflammatory. Flexeril 10 mg daily at bedtime and cautioned about sedation. Consider physical therapy if not continuing to improve. We extended her work out 1 more week and we'll plan for her tentatively to return 08-01-14

## 2014-07-25 NOTE — Telephone Encounter (Signed)
Letter faxed to pt job.

## 2014-07-25 NOTE — Telephone Encounter (Signed)
Pt is calling back pt will pick up note now. Pt needs work note to return to work on 07/29/14 w/no restrictions.

## 2014-07-25 NOTE — Telephone Encounter (Signed)
Pt needs note to return to work 11-24 no restrictions. Please fax to [employer] [phone]

## 2014-07-25 NOTE — Progress Notes (Signed)
Pre visit review using our clinic review tool, if applicable. No additional management support is needed unless otherwise documented below in the visit note. 

## 2014-07-25 NOTE — Telephone Encounter (Signed)
Pt needs a work note to return to work on 11-24 with no restrictions. Please fax to [employer] [phone]

## 2014-11-26 IMAGING — US US FETAL BPP W/O NONSTRESS
1 series · 13 of 13 positions shown · non-contrast
Comparison: none

OBSTETRICS REPORT
                      (Signed Final 12/30/2013 [DATE])

Service(s) Provided
Indications
 Non-reactive NST, FHR decelerations
Fetal Evaluation
 Num Of Fetuses:    1
 Fetal Heart Rate:  158                          bpm
 Cardiac Activity:  Observed
 Presentation:      Cephalic
 Comment:    [DATE] BPP in 7 mins.
 Amniotic Fluid
 AFI FV:      Subjectively upper-normal
                                             Larg Pckt:     7.5  cm
Biophysical Evaluation
 Amniotic F.V:   Within normal limits       F. Tone:        Observed
 F. Movement:    Observed                   Score:          [DATE]
 F. Breathing:   Observed
Gestational Age
 Best:          37w 5d     Det. By:  Early Ultrasound         EDD:   01/14/14
Impression
INDICATION: 28 yr old [id number] at 55w3d with contractions for
 BPP. Previous finding of polyhydramnios. Remote read.

[Series 1: us fetal bpp w/o nonstress · non-contrast · 13 acquisitions, 13 frames shown]
[im 1/13]
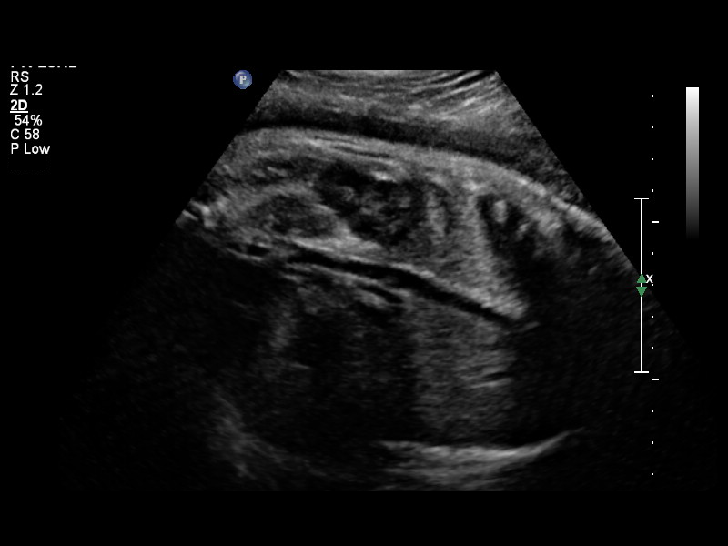
[im 2/13]
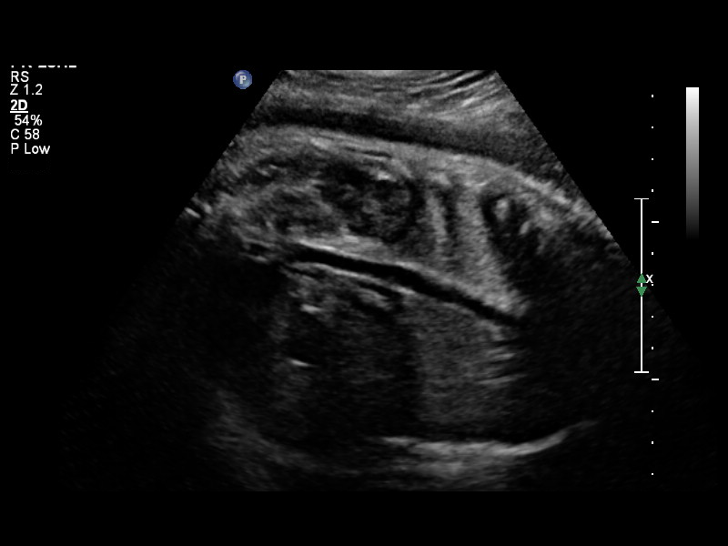
[im 3/13]
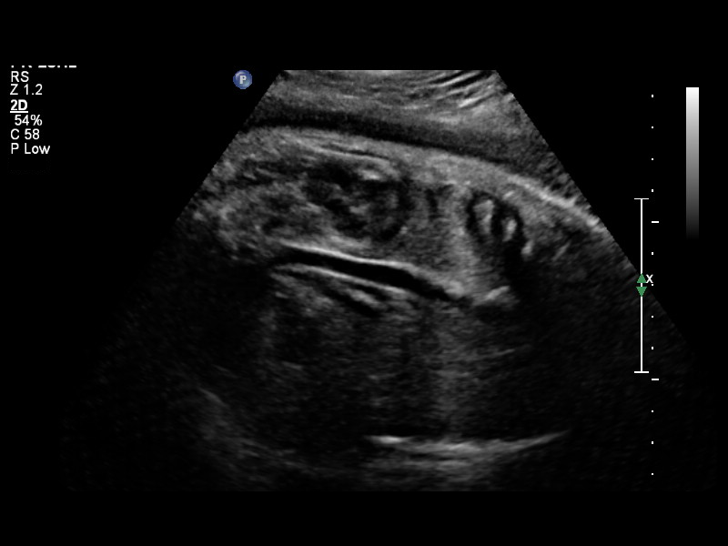
[im 4/13]
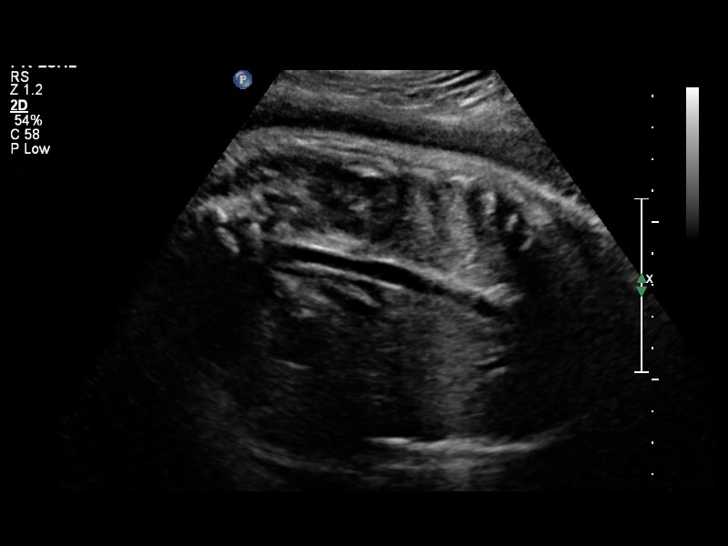
[im 5/13]
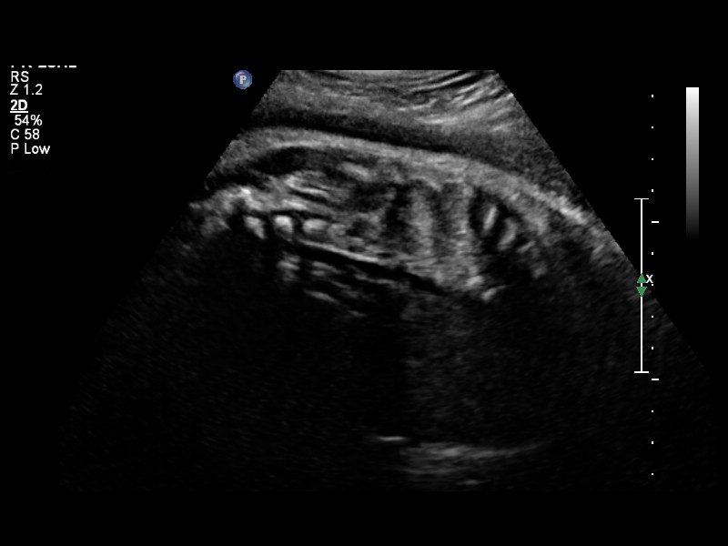
[im 6/13]
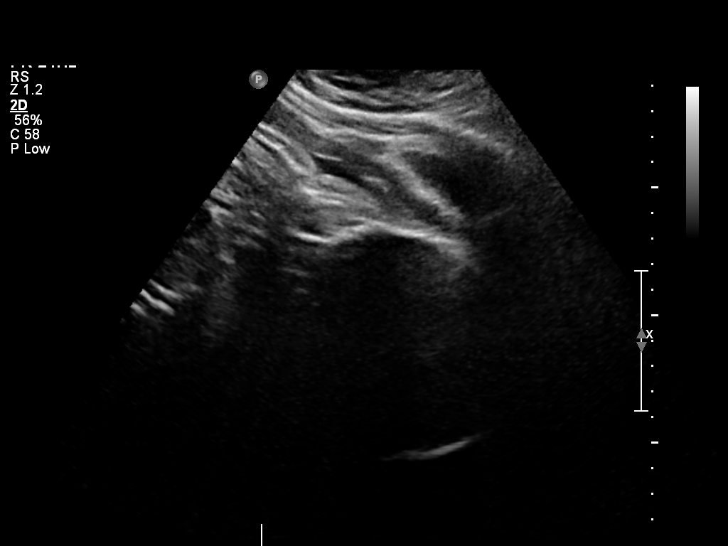
[im 7/13]
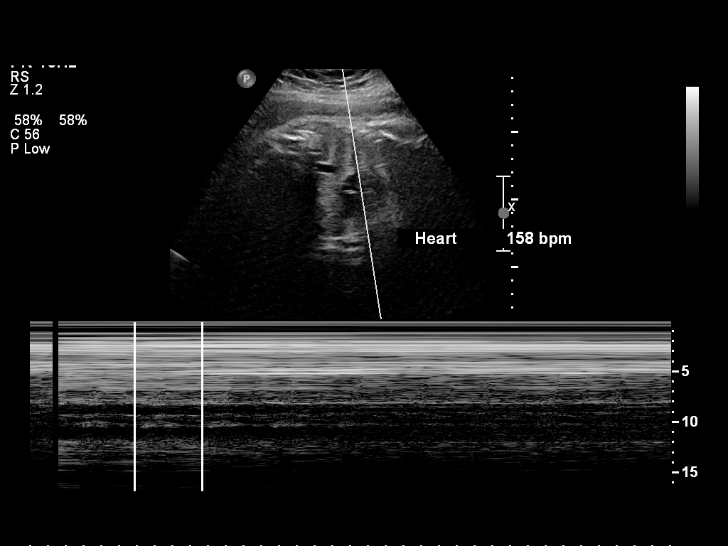
[im 8/13]
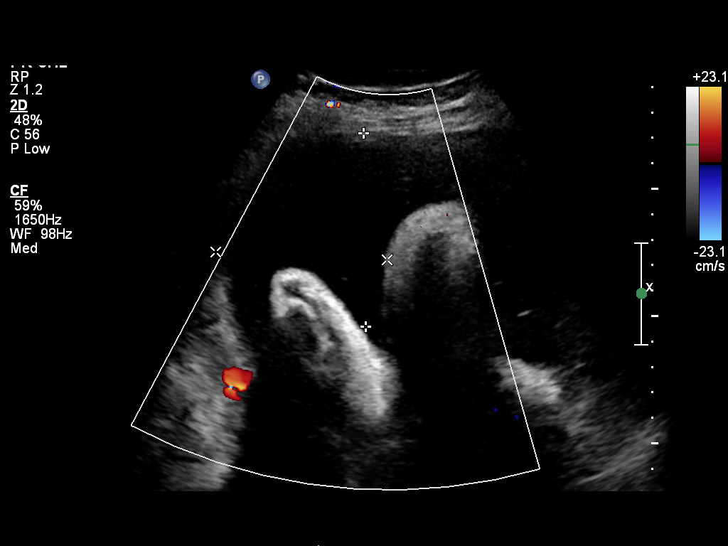
[im 9/13]
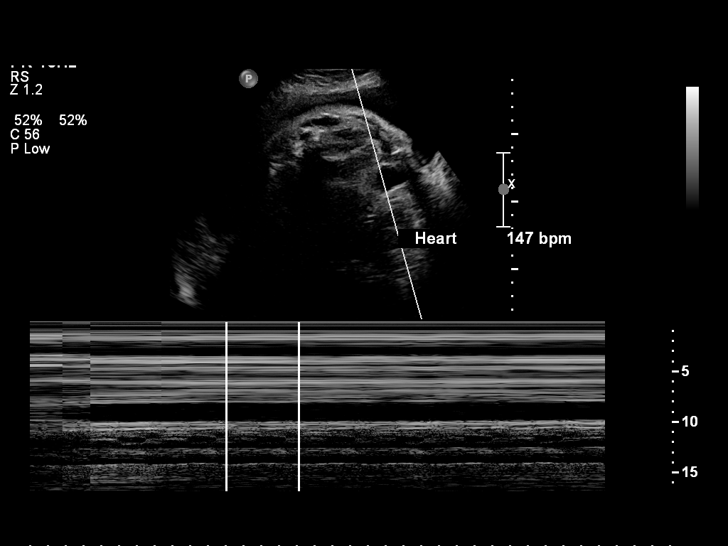
[im 10/13]
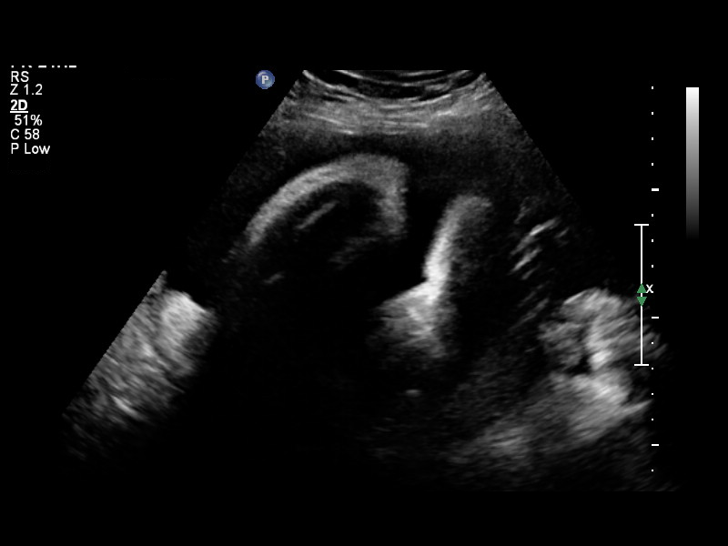
[im 11/13]
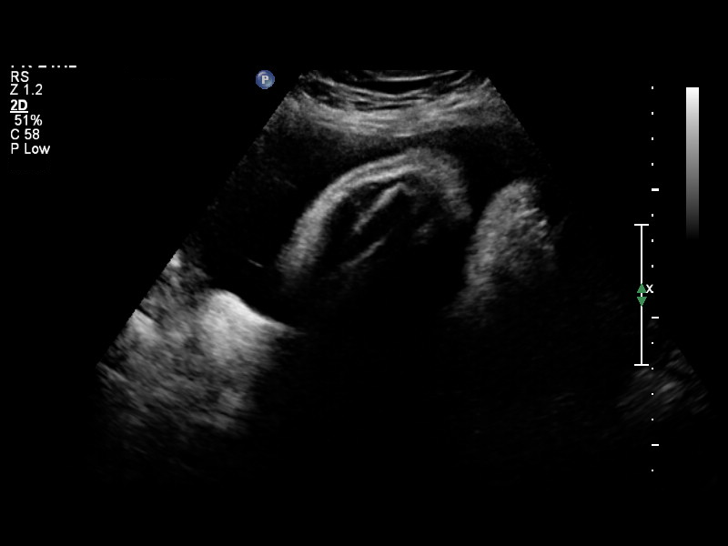
[im 12/13]
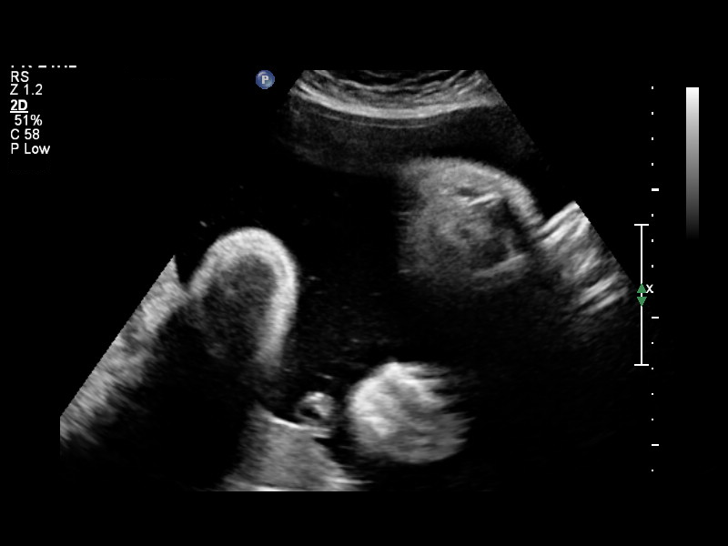
[im 13/13]
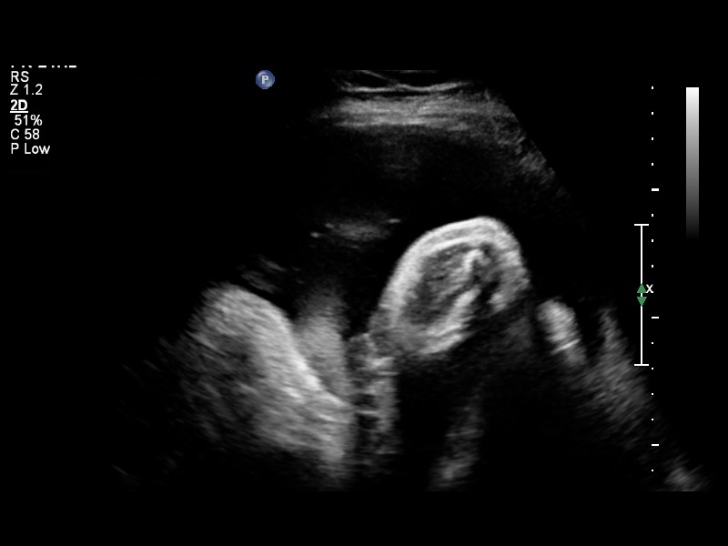

[13 of 13 positions shown; findings below may reference images not displayed]

FINDINGS: 1. Single intrauterine pregnancy.
 2. Normal maximum vertical pocket of amniotic fluid; although
 at upper limits of normal.
 3. Normal biophysical profile of [DATE].
Recommendations

 1. Normal biophysical profile.
 2. Management per primary OB.

## 2016-04-03 ENCOUNTER — Ambulatory Visit (INDEPENDENT_AMBULATORY_CARE_PROVIDER_SITE_OTHER): Payer: BLUE CROSS/BLUE SHIELD | Admitting: Family Medicine

## 2016-04-03 VITALS — BP 110/80 | HR 104 | Temp 98.1°F | Ht 64.0 in | Wt 260.0 lb

## 2016-04-03 DIAGNOSIS — M25562 Pain in left knee: Secondary | ICD-10-CM

## 2016-04-03 MED ORDER — MELOXICAM 15 MG PO TABS: 15.0000 mg | ORAL_TABLET | Freq: Every day | ORAL | 1 refills | Status: DC

## 2016-04-03 NOTE — Patient Instructions (Signed)
Continue with icing 2-3 times daily Avoid squatting as much as possible Consider elastic knee brace Take the anti-inflammatory once daily Touch base in 2-3 weeks if not improving.

## 2016-04-03 NOTE — Progress Notes (Signed)
Pre visit review using our clinic review tool, if applicable. No additional management support is needed unless otherwise documented below in the visit note. 

## 2016-04-03 NOTE — Progress Notes (Signed)
Subjective:     Patient ID: Gail Browning, female   DOB: 1986/04/05, 30 y.o.   MRN: [id number]  HPI Acute visit for left knee pain. She's had some mild pains for the past several weeks but this morning got up around 2 AM to go the bathroom and felt a "pop" sensation and noticed some mild swelling afterwards. Pain poorly localized. No locking or giving way. Dull pain which is worse in the lateral aspect of the knee around the joint space region. She has not taking medications today.  Past Medical History:  Diagnosis Date  . Abnormal Pap smear   . Chicken pox   . Endocrine disorder    high testosterone. treated with metformin  . 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   Marland Kitchen Hx of chlamydia infection   . Obese   . PCOS (polycystic ovarian syndrome)   . SINUSITIS, ACUTE 10/09/2009   Past Surgical History:  Procedure Laterality Date  . NO PAST SURGERIES    . TUBAL LIGATION Bilateral 12/30/2013   Procedure: POST PARTUM TUBAL LIGATION;  Surgeon: Oliver Pila, MD;  Location: [facility];  Service: Gynecology;  Laterality: Bilateral;    reports that she quit smoking about 4 years ago. Her smoking use included Cigarettes. She has a 1.50 pack-year smoking history. She has never used smokeless tobacco. She reports that she does not drink alcohol or use drugs. family history includes Arthritis in her other; COPD in her father; Cancer in her father and other; Diabetes in her maternal grandfather; Hyperlipidemia in her other; Hypertension in her father, maternal grandmother, and other; Seizures in her father; Stroke in her other. No Known Allergies   Review of Systems  Neurological: Negative for weakness and numbness.       Objective:   Physical Exam  Constitutional: She appears well-developed and well-nourished.  Cardiovascular: Normal rate and regular rhythm.   Musculoskeletal:  Left knee-no warmth. No erythema. Mild lateral joint line tenderness. No ligament  instability. No effusion. No ecchymosis       Assessment:     Left knee pain. Fairly nonfocal exam other than some minimal left lateral joint line tenderness. Question meniscus irritation/small tear.    Plan:     -Tried icing 2-3 times daily -Consider elastic knee support -Avoid squatting as much as possible -Meloxicam 15 mg once daily -Consider sports medicine referral if not improving over the next 2-3 weeks    Kristian Covey MD [facility]

## 2016-06-18 ENCOUNTER — Ambulatory Visit (INDEPENDENT_AMBULATORY_CARE_PROVIDER_SITE_OTHER): Payer: BLUE CROSS/BLUE SHIELD | Admitting: Family Medicine

## 2016-06-18 VITALS — BP 112/90 | HR 111 | Temp 98.5°F | Ht 64.0 in | Wt 254.3 lb

## 2016-06-18 DIAGNOSIS — M7711 Lateral epicondylitis, right elbow: Secondary | ICD-10-CM | POA: Diagnosis not present

## 2016-06-18 DIAGNOSIS — Z23 Encounter for immunization: Secondary | ICD-10-CM

## 2016-06-18 MED ORDER — DICLOFENAC SODIUM 1 % TD GEL: 2.0000 g | Freq: Four times a day (QID) | TRANSDERMAL | 1 refills | Status: DC

## 2016-06-18 NOTE — Progress Notes (Signed)
Pre visit review using our clinic review tool, if applicable. No additional management support is needed unless otherwise documented below in the visit note. 

## 2016-06-18 NOTE — Progress Notes (Signed)
Subjective:     Patient ID: Gail Browning, female   DOB: 06/09/1986, 30 y.o.   MRN: [id number]  HPI Patient seen with right elbow pain. Onset about 2 weeks ago. 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 She denies any specific injury. Location is right lateral elbow. Dull achy pain. Worse with gripping. No alleviating factors. No numbness. No weakness. No neck pain.  Past Medical History:  Diagnosis Date  . Abnormal Pap smear   . Chicken pox   . Endocrine disorder    high testosterone. treated with metformin  . 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   Marland Kitchen. Hx of chlamydia infection   . Obese   . PCOS (polycystic ovarian syndrome)   . SINUSITIS, ACUTE 10/09/2009   Past Surgical History:  Procedure Laterality Date  . NO PAST SURGERIES    . TUBAL LIGATION Bilateral 12/30/2013   Procedure: POST PARTUM TUBAL LIGATION;  Surgeon: Oliver PilaKathy W Richardson, MD;  Location: [facility];  Service: Gynecology;  Laterality: Bilateral;    reports that she quit smoking about 4 years ago. Her smoking use included Cigarettes. She has a 1.50 pack-year smoking history. She has never used smokeless tobacco. She reports that she does not drink alcohol or use drugs. family history includes Arthritis in her other; COPD in her father; Cancer in her father and other; Diabetes in her maternal grandfather; Hyperlipidemia in her other; Hypertension in her father, maternal grandmother, and other; Seizures in her father; Stroke in her other. No Known Allergies   Review of Systems  Neurological: Negative for weakness and numbness.       Objective:   Physical Exam  Constitutional: She appears well-developed and well-nourished.  Cardiovascular: Normal rate and regular rhythm.   Pulmonary/Chest: Effort normal and breath sounds normal. No respiratory distress. She has no wheezes. She has no rales.  Musculoskeletal:  Right elbow reveals no ecchymosis. No erythema. Full range of motion.  Tenderness over the lateral epicondylar region. She has pain right lateral elbow with wrist extension against resistance but not with wrist flexion.       Assessment:     Right lateral epicondylitis    Plan:     -Handout given with appropriate exercises -Recommend ice 3-4 times daily -Avoid repetitive gripping activities much as possible -Consider diclofenac gel 3-4 times daily -We discussed possible steroid injection in 3-4 weeks if not improving  Kristian CoveyBruce W Burchette MD [facility]

## 2016-06-18 NOTE — Patient Instructions (Signed)
Lateral Epicondylitis With Rehab Lateral epicondylitis involves inflammation and pain around the outer portion of the elbow. The pain is caused by inflammation of the tendons in the forearm that bring back (extend) the wrist. Lateral epicondylitis is also called tennis elbow, because it is very common in tennis players. However, it may occur in any individual who extends the wrist repetitively. If lateral epicondylitis is left untreated, it may become a chronic problem. SYMPTOMS   Pain, tenderness, and inflammation on the outer (lateral) side of the elbow.  Pain or weakness with gripping activities.  Pain that increases with wrist-twisting motions (playing tennis, using a screwdriver, opening a door or a jar).  Pain with lifting objects, including a coffee cup. CAUSES  Lateral epicondylitis is caused by inflammation of the tendons that extend the wrist. Causes of injury may include:  Repetitive stress and strain on the muscles and tendons that extend the wrist.  Sudden change in activity level or intensity.  Incorrect grip in racquet sports.  Incorrect grip size of racquet (often too large).  Incorrect hitting position or technique (usually backhand, leading with the elbow).  Using a racket that is too heavy. RISK INCREASES WITH:  Sports or occupations that require repetitive and/or strenuous forearm and wrist movements (tennis, squash, racquetball, carpentry).  Poor wrist and forearm strength and flexibility.  Failure to warm up properly before activity.  Resuming activity before healing, rehabilitation, and conditioning are complete. PREVENTION   Warm up and stretch properly before activity.  Maintain physical fitness:  Strength, flexibility, and endurance.  Cardiovascular fitness.  Wear and use properly fitted equipment.  Learn and use proper technique and have a coach correct improper technique.  Wear a tennis elbow (counterforce) brace. PROGNOSIS  The course of  this condition depends on the degree of the injury. If treated properly, acute cases (symptoms lasting less than 4 weeks) are often resolved in 2 to 6 weeks. Chronic (longer lasting cases) often resolve in 3 to 6 months but may require physical therapy. RELATED COMPLICATIONS   Frequently recurring symptoms, resulting in a chronic problem. Properly treating the problem the first time decreases frequency of recurrence.  Chronic inflammation, scarring tendon degeneration, and partial tendon tear, requiring surgery.  Delayed healing or resolution of symptoms. TREATMENT  Treatment first involves the use of ice and medicine to reduce pain and inflammation. Strengthening and stretching exercises may help reduce discomfort if performed regularly. These exercises may be performed at home if the condition is an acute injury. Chronic cases may require a referral to a physical therapist for evaluation and treatment. Your caregiver may advise a corticosteroid injection to help reduce inflammation. Rarely, surgery is needed. MEDICATION  If pain medicine is needed, nonsteroidal anti-inflammatory medicines (aspirin and ibuprofen), or other minor pain relievers (acetaminophen), are often advised.  Do not take pain medicine for 7 days before surgery.  Prescription pain relievers may be given, if your caregiver thinks they are needed. Use only as directed and only as much as you need.  Corticosteroid injections may be recommended. These injections should be reserved only for the most severe cases, because they can only be given a certain number of times. HEAT AND COLD  Cold treatment (icing) should be applied for 10 to 15 minutes every 2 to 3 hours for inflammation and pain, and immediately after activity that aggravates your symptoms. Use ice packs or an ice massage.  Heat treatment may be used before performing stretching and strengthening activities prescribed by your caregiver, physical therapist,   or  athletic trainer. Use a heat pack or a warm water soak. SEEK MEDICAL CARE IF: Symptoms get worse or do not improve in 2 weeks, despite treatment. EXERCISES  RANGE OF MOTION (ROM) AND STRETCHING EXERCISES - Epicondylitis, Lateral (Tennis Elbow) These exercises may help you when beginning to rehabilitate your injury. Your symptoms may go away with or without further involvement from your physician, physical therapist, or athletic trainer. While completing these exercises, remember:   Restoring tissue flexibility helps normal motion to return to the joints. This allows healthier, less painful movement and activity.  An effective stretch should be held for at least 30 seconds.  A stretch should never be painful. You should only feel a gentle lengthening or release in the stretched tissue. RANGE OF MOTION - Wrist Flexion, Active-Assisted  Extend your right / left elbow with your fingers pointing down.*  Gently pull the back of your hand towards you, until you feel a gentle stretch on the top of your forearm.  Hold this position for __________ seconds. Repeat __________ times. Complete this exercise __________ times per day.  *If directed by your physician, physical therapist or athletic trainer, complete this stretch with your elbow bent, rather than extended. RANGE OF MOTION - Wrist Extension, Active-Assisted  Extend your right / left elbow and turn your palm upwards.*  Gently pull your palm and fingertips back, so your wrist extends and your fingers point more toward the ground.  You should feel a gentle stretch on the inside of your forearm.  Hold this position for __________ seconds. Repeat __________ times. Complete this exercise __________ times per day. *If directed by your physician, physical therapist or athletic trainer, complete this stretch with your elbow bent, rather than extended. STRETCH - Wrist Flexion  Place the back of your right / left hand on a tabletop, leaving your  elbow slightly bent. Your fingers should point away from your body.  Gently press the back of your hand down onto the table by straightening your elbow. You should feel a stretch on the top of your forearm.  Hold this position for __________ seconds. Repeat __________ times. Complete this stretch __________ times per day.  STRETCH - Wrist Extension   Place your right / left fingertips on a tabletop, leaving your elbow slightly bent. Your fingers should point backwards.  Gently press your fingers and palm down onto the table by straightening your elbow. You should feel a stretch on the inside of your forearm.  Hold this position for __________ seconds. Repeat __________ times. Complete this stretch __________ times per day.  STRENGTHENING EXERCISES - Epicondylitis, Lateral (Tennis Elbow) These exercises may help you when beginning to rehabilitate your injury. They may resolve your symptoms with or without further involvement from your physician, physical therapist, or athletic trainer. While completing these exercises, remember:   Muscles can gain both the endurance and the strength needed for everyday activities through controlled exercises.  Complete these exercises as instructed by your physician, physical therapist or athletic trainer. Increase the resistance and repetitions only as guided.  You may experience muscle soreness or fatigue, but the pain or discomfort you are trying to eliminate should never worsen during these exercises. If this pain does get worse, stop and make sure you are following the directions exactly. If the pain is still present after adjustments, discontinue the exercise until you can discuss the trouble with your caregiver. STRENGTH - Wrist Flexors  Sit with your right / left forearm palm-up and fully supported   on a table or countertop. Your elbow should be resting below the height of your shoulder. Allow your wrist to extend over the edge of the  surface.  Loosely holding a __________ weight, or a piece of rubber exercise band or tubing, slowly curl your hand up toward your forearm.  Hold this position for __________ seconds. Slowly lower the wrist back to the starting position in a controlled manner. Repeat __________ times. Complete this exercise __________ times per day.  STRENGTH - Wrist Extensors  Sit with your right / left forearm palm-down and fully supported on a table or countertop. Your elbow should be resting below the height of your shoulder. Allow your wrist to extend over the edge of the surface.  Loosely holding a __________ weight, or a piece of rubber exercise band or tubing, slowly curl your hand up toward your forearm.  Hold this position for __________ seconds. Slowly lower the wrist back to the starting position in a controlled manner. Repeat __________ times. Complete this exercise __________ times per day.  STRENGTH - Ulnar Deviators  Stand with a ____________________ weight in your right / left hand, or sit while holding a rubber exercise band or tubing, with your healthy arm supported on a table or countertop.  Move your wrist, so that your pinkie travels toward your forearm and your thumb moves away from your forearm.  Hold this position for __________ seconds and then slowly lower the wrist back to the starting position. Repeat __________ times. Complete this exercise __________ times per day STRENGTH - Radial Deviators  Stand with a ____________________ weight in your right / left hand, or sit while holding a rubber exercise band or tubing, with your injured arm supported on a table or countertop.  Raise your hand upward in front of you or pull up on the rubber tubing.  Hold this position for __________ seconds and then slowly lower the wrist back to the starting position. Repeat __________ times. Complete this exercise __________ times per day. STRENGTH - Forearm Supinators   Sit with your right /  left forearm supported on a table, keeping your elbow below shoulder height. Rest your hand over the edge, palm down.  Gently grip a hammer or a soup ladle.  Without moving your elbow, slowly turn your palm and hand upward to a "thumbs-up" position.  Hold this position for __________ seconds. Slowly return to the starting position. Repeat __________ times. Complete this exercise __________ times per day.  STRENGTH - Forearm Pronators   Sit with your right / left forearm supported on a table, keeping your elbow below shoulder height. Rest your hand over the edge, palm up.  Gently grip a hammer or a soup ladle.  Without moving your elbow, slowly turn your palm and hand upward to a "thumbs-up" position.  Hold this position for __________ seconds. Slowly return to the starting position. Repeat __________ times. Complete this exercise __________ times per day.  STRENGTH - Grip  Grasp a tennis ball, a dense sponge, or a large, rolled sock in your hand.  Squeeze as hard as you can, without increasing any pain.  Hold this position for __________ seconds. Release your grip slowly. Repeat __________ times. Complete this exercise __________ times per day.  STRENGTH - Elbow Extensors, Isometric  Stand or sit upright, on a firm surface. Place your right / left arm so that your palm faces your stomach, and it is at the height of your waist.  Place your opposite hand on the underside   of your forearm. Gently push up as your right / left arm resists. Push as hard as you can with both arms, without causing any pain or movement at your right / left elbow. Hold this stationary position for __________ seconds. Gradually release the tension in both arms. Allow your muscles to relax completely before repeating.   This information is not intended to replace advice given to you by your health care provider. Make sure you discuss any questions you have with your health care provider.   Document Released:  08/19/2005 Document Revised: 09/09/2014 Document Reviewed: 12/01/2008 Elsevier Interactive Patient Education 2016 [employer]  

## 2016-08-23 ENCOUNTER — Emergency Department (HOSPITAL_COMMUNITY): Payer: BLUE CROSS/BLUE SHIELD

## 2016-08-23 ENCOUNTER — Encounter (HOSPITAL_COMMUNITY): Payer: Self-pay | Admitting: *Deleted

## 2016-08-23 ENCOUNTER — Emergency Department (HOSPITAL_COMMUNITY)
Admission: EM | Admit: 2016-08-23 | Discharge: 2016-08-24 | Disposition: A | Discharge status: Home / Self Care | Payer: BLUE CROSS/BLUE SHIELD | Attending: Emergency Medicine | Admitting: Emergency Medicine

## 2016-08-23 DIAGNOSIS — J40 Bronchitis, not specified as acute or chronic: Secondary | ICD-10-CM | POA: Insufficient documentation

## 2016-08-23 DIAGNOSIS — Z79899 Other long term (current) drug therapy: Secondary | ICD-10-CM | POA: Diagnosis not present

## 2016-08-23 DIAGNOSIS — R05 Cough: Secondary | ICD-10-CM | POA: Diagnosis present

## 2016-08-23 DIAGNOSIS — F17210 Nicotine dependence, cigarettes, uncomplicated: Secondary | ICD-10-CM | POA: Diagnosis not present

## 2016-08-23 MED ORDER — GUAIFENESIN-CODEINE 100-10 MG/5ML PO SYRP: 10.0000 mL | ORAL_SOLUTION | Freq: Three times a day (TID) | ORAL | 0 refills | Status: DC | PRN

## 2016-08-23 MED ORDER — ALBUTEROL SULFATE HFA 108 (90 BASE) MCG/ACT IN AERS
2.0000 | INHALATION_SPRAY | Freq: Once | RESPIRATORY_TRACT | Status: AC
  Administered 2016-08-23: 2 via RESPIRATORY_TRACT
  Filled 2016-08-23: qty 6.7

## 2016-08-23 MED ORDER — PREDNISONE 20 MG PO TABS
40.0000 mg | ORAL_TABLET | Freq: Once | ORAL | Status: AC
  Administered 2016-08-23: 40 mg via ORAL
  Filled 2016-08-23: qty 2

## 2016-08-23 MED ORDER — PREDNISONE 20 MG PO TABS: 40.0000 mg | ORAL_TABLET | Freq: Every day | ORAL | 0 refills | Status: DC

## 2016-08-23 NOTE — Discharge Instructions (Signed)
2 puffs of the inhaler 4 times a day as needed.  Start the prednisone prescription tomorrow.  Follow-up with your doctor for recheck if needed.

## 2016-08-23 NOTE — ED Provider Notes (Signed)
[facility]-EMER[facility]Y DEPT Provider Note   CSN: [id number] Arrival date & time: 08/23/16  2121     History   Chief Complaint Chief Complaint  Patient presents with  . Cough    HPI Gail Browning is a 30 y.o. female.  HPI   Gail Browning is a 30 y.o. female who presents to the Emergency Department complaining of cough and nasal congestion for one day.  She reports onset of worsening cough since yesterday associated with upper chest tightness.  Cough is productive.  Worse when lying flat.  She has not taken any medications for symptoms relief.  She denies fever, shortness of breath, hx of PNA or asthma.     Past Medical History:  Diagnosis Date  . Abnormal Pap smear   . Chicken pox   . Endocrine disorder    high testosterone. treated with metformin  . 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   Marland Kitchen. Hx of chlamydia infection   . Obese   . PCOS (polycystic ovarian syndrome)   . SINUSITIS, ACUTE 10/09/2009    Patient Active Problem List   Diagnosis Date Noted  . NSVD (normal spontaneous vaginal delivery) 12/30/2013  . Active labor at term 12/29/2013  . SINUSITIS, ACUTE 10/09/2009    Past Surgical History:  Procedure Laterality Date  . NO PAST SURGERIES    . TUBAL LIGATION Bilateral 12/30/2013   Procedure: POST PARTUM TUBAL LIGATION;  Surgeon: Oliver PilaKathy W Richardson, MD;  Location: [facility];  Service: Gynecology;  Laterality: Bilateral;    OB History    Gravida Para Term Preterm AB Living   2 2 2  0 0 2   SAB TAB Ectopic Multiple Live Births   0 0 0 0 2       Home Medications    Prior to Admission medications   Medication Sig Start Date End Date Taking? Authorizing Provider  Multiple Vitamin (MULTIVITAMIN) tablet Take 1 tablet by mouth daily.   Yes Historical Provider, MD  diclofenac sodium (VOLTAREN) 1 % GEL Apply 2 g topically 4 (four) times daily. Patient not taking: Reported on 08/23/2016 06/18/16   Kristian CoveyBruce W Burchette, MD  ketoconazole  (NIZORAL) 200 MG tablet Take 2 tablets times one dose prn for Tinea Versicolor flare ups. Patient not taking: Reported on 08/23/2016 07/08/14   Kristian CoveyBruce W Burchette, MD  meloxicam (MOBIC) 15 MG tablet Take 1 tablet (15 mg total) by mouth daily. Patient not taking: Reported on 08/23/2016 04/03/16   Kristian CoveyBruce W Burchette, MD    Family History Family History  Problem Relation Age of Onset  . Cancer Father     prostate  . Seizures Father   . Hypertension Father   . COPD Father   . Stroke Other   . Cancer Other     lung  . Hypertension Other   . Hyperlipidemia Other   . Arthritis Other   . Hypertension Maternal Grandmother   . Diabetes Maternal Grandfather     Social History Social History  Substance Use Topics  . Smoking status: Current Every Day Smoker    Packs/day: 0.50    Years: 3.00    Types: Cigarettes  . Smokeless tobacco: Never Used  . Alcohol use No     Allergies   Patient has no known allergies.   Review of Systems Review of Systems  Constitutional: Negative for activity change, appetite change, chills and fever.  HENT: Positive for congestion and rhinorrhea. Negative for facial swelling, sore throat and trouble  swallowing.   Eyes: Negative for visual disturbance.  Respiratory: Positive for cough and chest tightness. Negative for shortness of breath, wheezing and stridor.   Cardiovascular: Negative for palpitations.  Gastrointestinal: Negative for abdominal pain, nausea and vomiting.  Musculoskeletal: Negative for myalgias, neck pain and neck stiffness.  Skin: Negative.   Neurological: Negative for dizziness, weakness, numbness and headaches.  Hematological: Negative for adenopathy.  Psychiatric/Behavioral: Negative for confusion.  All other systems reviewed and are negative.    Physical Exam Updated Vital Signs BP 134/83   Pulse 77   Temp 97.8 F (36.6 C)   Resp 20   Ht 5\' 4"  (1.626 m)   Wt 114.8 kg   LMP 08/04/2016   SpO2 100%   BMI 43.43 kg/m    Physical Exam  Constitutional: She is oriented to person, place, and time. She appears well-developed and well-nourished. No distress.  HENT:  Head: Normocephalic and atraumatic.  Right Ear: Tympanic membrane and ear canal normal.  Left Ear: Tympanic membrane and ear canal normal.  Nose: Mucosal edema and rhinorrhea present.  Mouth/Throat: Uvula is midline, oropharynx is clear and moist and mucous membranes are normal. No oropharyngeal exudate.  Eyes: EOM are normal. Pupils are equal, round, and reactive to light.  Neck: Normal range of motion, full passive range of motion without pain and phonation normal. Neck supple.  Cardiovascular: Normal rate, regular rhythm and intact distal pulses.   No murmur heard. Pulmonary/Chest: Effort normal. No respiratory distress. She has no wheezes. She has no rales. She exhibits no tenderness.  Coarse lungs sounds bilaterally.    Abdominal: Soft. She exhibits no distension. There is no tenderness.  Musculoskeletal: Normal range of motion. She exhibits no edema.  Lymphadenopathy:    She has no cervical adenopathy.  Neurological: She is alert and oriented to person, place, and time. She exhibits normal muscle tone. Coordination normal.  Skin: Skin is warm and dry. No rash noted.  Nursing note and vitals reviewed.    ED Treatments / Results  Labs (all labs ordered are listed, but only abnormal results are displayed) Labs Reviewed - No data to display  EKG  EKG Interpretation None       Radiology Dg Chest 2 View  Result Date: 08/23/2016 CLINICAL DATA:  Midsternal tightness and cough, onset last night EXAM: CHEST  2 VIEW COMPARISON:  07/07/2015 FINDINGS: The heart size and mediastinal contours are within normal limits. Both lungs are clear. The visualized skeletal structures are unremarkable. IMPRESSION: No active cardiopulmonary disease. Electronically Signed   By: Ellery Plunkaniel R Mitchell M.D.   On: 08/23/2016 23:25    Procedures Procedures  (including critical care time)  Medications Ordered in ED Medications - No data to display   Initial Impression / Assessment and Plan / ED Course  I have reviewed the triage vital signs and the nursing notes.  Pertinent labs & imaging results that were available during my care of the patient were reviewed by me and considered in my medical decision making (see chart for details).  Clinical Course     Pt well appearing.  Non-toxic.  Vitals stable.  No tachycardia or tachypnea. CXR neg for PNA.  Likely viral bronchitis.  Pt agrees to tx plan which includes albuterol inhaler, steroids and anti-tussive.    Final Clinical Impressions(s) / ED Diagnoses   Final diagnoses:  Bronchitis    New Prescriptions New Prescriptions   No medications on file     Pauline Ausammy Denario Bagot, PA-C 08/24/16 [id number]  Mancel Bale, MD 08/27/16 [phone]

## 2016-08-23 NOTE — ED Triage Notes (Addendum)
Pt reports cough and congestion. Pt c/o pain in her chest when she coughs or takes a deep breath. Pt states she is coughing up clear mucous. Pt denies fever.

## 2017-06-18 ENCOUNTER — Ambulatory Visit: Payer: Self-pay | Admitting: Women's Health

## 2017-06-18 ENCOUNTER — Encounter: Payer: Self-pay | Admitting: Women's Health

## 2017-06-18 ENCOUNTER — Ambulatory Visit (INDEPENDENT_AMBULATORY_CARE_PROVIDER_SITE_OTHER): Payer: Self-pay | Admitting: Women's Health

## 2017-06-18 VITALS — BP 118/80 | Ht 64.0 in | Wt 272.0 lb

## 2017-06-18 DIAGNOSIS — Z01419 Encounter for gynecological examination (general) (routine) without abnormal findings: Secondary | ICD-10-CM

## 2017-06-18 DIAGNOSIS — B009 Herpesviral infection, unspecified: Secondary | ICD-10-CM

## 2017-06-18 MED ORDER — ACYCLOVIR 200 MG PO CAPS: 200.0000 mg | ORAL_CAPSULE | Freq: Every day | ORAL | 6 refills | Status: DC

## 2017-06-18 NOTE — Patient Instructions (Signed)
Carbohydrate Counting for Diabetes Mellitus, Adult Carbohydrate counting is a method for keeping track of how many carbohydrates you eat. Eating carbohydrates naturally increases the amount of sugar (glucose) in the blood. Counting how many carbohydrates you eat helps keep your blood glucose within normal limits, which helps you manage your diabetes (diabetes mellitus). It is important to know how many carbohydrates you can safely have in each meal. This is different for every person. A diet and nutrition specialist (registered dietitian) can help you make a meal plan and calculate how many carbohydrates you should have at each meal and snack. Carbohydrates are found in the following foods:  Grains, such as breads and cereals.  Dried beans and soy products.  Starchy vegetables, such as potatoes, peas, and corn.  Fruit and fruit juices.  Milk and yogurt.  Sweets and snack foods, such as cake, cookies, candy, chips, and soft drinks.  How do I count carbohydrates? There are two ways to count carbohydrates in food. You can use either of the methods or a combination of both. Reading "Nutrition Facts" on packaged food The "Nutrition Facts" list is included on the labels of almost all packaged foods and beverages in the [place] It includes:  The serving size.  Information about nutrients in each serving, including the grams (g) of carbohydrate per serving.  To use the "Nutrition Facts":  Decide how many servings you will have.  Multiply the number of servings by the number of carbohydrates per serving.  The resulting number is the total amount of carbohydrates that you will be having.  Learning standard serving sizes of other foods When you eat foods containing carbohydrates that are not packaged or do not include "Nutrition Facts" on the label, you need to measure the servings in order to count the amount of carbohydrates:  Measure the foods that you will eat with a food scale or  measuring cup, if needed.  Decide how many standard-size servings you will eat.  Multiply the number of servings by 15. Most carbohydrate-rich foods have about 15 g of carbohydrates per serving. ? For example, if you eat 8 oz (170 g) of strawberries, you will have eaten 2 servings and 30 g of carbohydrates (2 servings x 15 g = 30 g).  For foods that have more than one food mixed, such as soups and casseroles, you must count the carbohydrates in each food that is included.  The following list contains standard serving sizes of common carbohydrate-rich foods. Each of these servings has about 15 g of carbohydrates:   hamburger bun or  English muffin.   oz (15 mL) syrup.   oz (14 g) jelly.  1 slice of bread.  1 six-inch tortilla.  3 oz (85 g) cooked rice or pasta.  4 oz (113 g) cooked dried beans.  4 oz (113 g) starchy vegetable, such as peas, corn, or potatoes.  4 oz (113 g) hot cereal.  4 oz (113 g) mashed potatoes or  of a large baked potato.  4 oz (113 g) canned or frozen fruit.  4 oz (120 mL) fruit juice.  4-6 crackers.  6 chicken nuggets.  6 oz (170 g) unsweetened dry cereal.  6 oz (170 g) plain fat-free yogurt or yogurt sweetened with artificial sweeteners.  8 oz (240 mL) milk.  8 oz (170 g) fresh fruit or one small piece of fruit.  24 oz (680 g) popped popcorn.  Example of carbohydrate counting Sample meal  3 oz (85 g) chicken breast.    6 oz (170 g) brown rice.  4 oz (113 g) corn.  8 oz (240 mL) milk.  8 oz (170 g) strawberries with sugar-free whipped topping. Carbohydrate calculation 1. Identify the foods that contain carbohydrates: ? Rice. ? Corn. ? Milk. ? Strawberries. 2. Calculate how many servings you have of each food: ? 2 servings rice. ? 1 serving corn. ? 1 serving milk. ? 1 serving strawberries. 3. Multiply each number of servings by 15 g: ? 2 servings rice x 15 g = 30 g. ? 1 serving corn x 15 g = 15 g. ? 1 serving milk x 15  g = 15 g. ? 1 serving strawberries x 15 g = 15 g. 4. Add together all of the amounts to find the total grams of carbohydrates eaten: ? 30 g + 15 g + 15 g + 15 g = 75 g of carbohydrates total. This information is not intended to replace advice given to you by your health care provider. Make sure you discuss any questions you have with your health care provider. Document Released: 08/19/2005 Document Revised: 03/08/2016 Document Reviewed: 01/31/2016 Elsevier Interactive Patient Education  2018 [place] Maintenance, Female Adopting a healthy lifestyle and getting preventive care can go a long way to promote health and wellness. Talk with your health care provider about what schedule of regular examinations is right for you. This is a good chance for you to check in with your provider about disease prevention and staying healthy. In between checkups, there are plenty of things you can do on your own. Experts have done a lot of research about which lifestyle changes and preventive measures are most likely to keep you healthy. Ask your health care provider for more information. Weight and diet Eat a healthy diet  Be sure to include plenty of vegetables, fruits, low-fat dairy products, and lean protein.  Do not eat a lot of foods high in solid fats, added sugars, or salt.  Get regular exercise. This is one of the most important things you can do for your health. ? Most adults should exercise for at least 150 minutes each week. The exercise should increase your heart rate and make you sweat (moderate-intensity exercise). ? Most adults should also do strengthening exercises at least twice a week. This is in addition to the moderate-intensity exercise.  Maintain a healthy weight  Body mass index (BMI) is a measurement that can be used to identify possible weight problems. It estimates body fat based on height and weight. Your health care provider can help determine your BMI and help you  achieve or maintain a healthy weight.  For females 29 years of age and older: ? A BMI below 18.5 is considered underweight. ? A BMI of 18.5 to 24.9 is normal. ? A BMI of 25 to 29.9 is considered overweight. ? A BMI of 30 and above is considered obese.  Watch levels of cholesterol and blood lipids  You should start having your blood tested for lipids and cholesterol at 29 years of age, then have this test every 5 years.  You may need to have your cholesterol levels checked more often if: ? Your lipid or cholesterol levels are high. ? You are older than 30 years of age. ? You are at high risk for heart disease.  Cancer screening Lung Cancer  Lung cancer screening is recommended for adults 52-25 years old who are at high risk for lung cancer because of a history of smoking.  A  yearly low-dose CT scan of the lungs is recommended for people who: ? Currently smoke. ? Have quit within the past 15 years. ? Have at least a 30-pack-year history of smoking. A pack year is smoking an average of one pack of cigarettes a day for 1 year.  Yearly screening should continue until it has been 15 years since you quit.  Yearly screening should stop if you develop a health problem that would prevent you from having lung cancer treatment.  Breast Cancer  Practice breast self-awareness. This means understanding how your breasts normally appear and feel.  It also means doing regular breast self-exams. Let your health care provider know about any changes, no matter how small.  If you are in your 20s or 30s, you should have a clinical breast exam (CBE) by a health care provider every 1-3 years as part of a regular health exam.  If you are 4 or older, have a CBE every year. Also consider having a breast X-ray (mammogram) every year.  If you have a family history of breast cancer, talk to your health care provider about genetic screening.  If you are at high risk for breast cancer, talk to your health  care provider about having an MRI and a mammogram every year.  Breast cancer gene (BRCA) assessment is recommended for women who have family members with BRCA-related cancers. BRCA-related cancers include: ? Breast. ? Ovarian. ? Tubal. ? Peritoneal cancers.  Results of the assessment will determine the need for genetic counseling and BRCA1 and BRCA2 testing.  Cervical Cancer Your health care provider may recommend that you be screened regularly for cancer of the pelvic organs (ovaries, uterus, and vagina). This screening involves a pelvic examination, including checking for microscopic changes to the surface of your cervix (Pap test). You may be encouraged to have this screening done every 3 years, beginning at age 12.  For women ages 13-65, health care providers may recommend pelvic exams and Pap testing every 3 years, or they may recommend the Pap and pelvic exam, combined with testing for human papilloma virus (HPV), every 5 years. Some types of HPV increase your risk of cervical cancer. Testing for HPV may also be done on women of any age with unclear Pap test results.  Other health care providers may not recommend any screening for nonpregnant women who are considered low risk for pelvic cancer and who do not have symptoms. Ask your health care provider if a screening pelvic exam is right for you.  If you have had past treatment for cervical cancer or a condition that could lead to cancer, you need Pap tests and screening for cancer for at least 20 years after your treatment. If Pap tests have been discontinued, your risk factors (such as having a new sexual partner) need to be reassessed to determine if screening should resume. Some women have medical problems that increase the chance of getting cervical cancer. In these cases, your health care provider may recommend more frequent screening and Pap tests.  Colorectal Cancer  This type of cancer can be detected and often  prevented.  Routine colorectal cancer screening usually begins at 29 years of age and continues through 29 years of age.  Your health care provider may recommend screening at an earlier age if you have risk factors for colon cancer.  Your health care provider may also recommend using home test kits to check for hidden blood in the stool.  A small camera at the end of  a tube can be used to examine your colon directly (sigmoidoscopy or colonoscopy). This is done to check for the earliest forms of colorectal cancer.  Routine screening usually begins at age 45.  Direct examination of the colon should be repeated every 5-10 years through 29 years of age. However, you may need to be screened more often if early forms of precancerous polyps or small growths are found.  Skin Cancer  Check your skin from head to toe regularly.  Tell your health care provider about any new moles or changes in moles, especially if there is a change in a mole's shape or color.  Also tell your health care provider if you have a mole that is larger than the size of a pencil eraser.  Always use sunscreen. Apply sunscreen liberally and repeatedly throughout the day.  Protect yourself by wearing long sleeves, pants, a wide-brimmed hat, and sunglasses whenever you are outside.  Heart disease, diabetes, and high blood pressure  High blood pressure causes heart disease and increases the risk of stroke. High blood pressure is more likely to develop in: ? People who have blood pressure in the high end of the normal range (130-139/85-89 mm Hg). ? People who are overweight or obese. ? People who are African American.  If you are 65-44 years of age, have your blood pressure checked every 3-5 years. If you are 35 years of age or older, have your blood pressure checked every year. You should have your blood pressure measured twice-once when you are at a hospital or clinic, and once when you are not at a hospital or clinic.  Record the average of the two measurements. To check your blood pressure when you are not at a hospital or clinic, you can use: ? An automated blood pressure machine at a pharmacy. ? A home blood pressure monitor.  If you are between 55 years and 4 years old, ask your health care provider if you should take aspirin to prevent strokes.  Have regular diabetes screenings. This involves taking a blood sample to check your fasting blood sugar level. ? If you are at a normal weight and have a low risk for diabetes, have this test once every three years after 29 years of age. ? If you are overweight and have a high risk for diabetes, consider being tested at a younger age or more often. Preventing infection Hepatitis B  If you have a higher risk for hepatitis B, you should be screened for this virus. You are considered at high risk for hepatitis B if: ? You were born in a country where hepatitis B is common. Ask your health care provider which countries are considered high risk. ? Your parents were born in a high-risk country, and you have not been immunized against hepatitis B (hepatitis B vaccine). ? You have HIV or AIDS. ? You use needles to inject street drugs. ? You live with someone who has hepatitis B. ? You have had sex with someone who has hepatitis B. ? You get hemodialysis treatment. ? You take certain medicines for conditions, including cancer, organ transplantation, and autoimmune conditions.  Hepatitis C  Blood testing is recommended for: ? Everyone born from [birth year] through 1963. ? Anyone with known risk factors for hepatitis C.  Sexually transmitted infections (STIs)  You should be screened for sexually transmitted infections (STIs) including gonorrhea and chlamydia if: ? You are sexually active and are younger than 29 years of age. ? You are older than  29 years of age and your health care provider tells you that you are at risk for this type of infection. ? Your sexual  activity has changed since you were last screened and you are at an increased risk for chlamydia or gonorrhea. Ask your health care provider if you are at risk.  If you do not have HIV, but are at risk, it may be recommended that you take a prescription medicine daily to prevent HIV infection. This is called pre-exposure prophylaxis (PrEP). You are considered at risk if: ? You are sexually active and do not regularly use condoms or know the HIV status of your partner(s). ? You take drugs by injection. ? You are sexually active with a partner who has HIV.  Talk with your health care provider about whether you are at high risk of being infected with HIV. If you choose to begin PrEP, you should first be tested for HIV. You should then be tested every 3 months for as long as you are taking PrEP. Pregnancy  If you are premenopausal and you may become pregnant, ask your health care provider about preconception counseling.  If you may become pregnant, take 400 to 800 micrograms (mcg) of folic acid every day.  If you want to prevent pregnancy, talk to your health care provider about birth control (contraception). Osteoporosis and menopause  Osteoporosis is a disease in which the bones lose minerals and strength with aging. This can result in serious bone fractures. Your risk for osteoporosis can be identified using a bone density scan.  If you are 29 years of age or older, or if you are at risk for osteoporosis and fractures, ask your health care provider if you should be screened.  Ask your health care provider whether you should take a calcium or vitamin D supplement to lower your risk for osteoporosis.  Menopause may have certain physical symptoms and risks.  Hormone replacement therapy may reduce some of these symptoms and risks. Talk to your health care provider about whether hormone replacement therapy is right for you. Follow these instructions at home:  Schedule regular health, dental,  and eye exams.  Stay current with your immunizations.  Do not use any tobacco products including cigarettes, chewing tobacco, or electronic cigarettes.  If you are pregnant, do not drink alcohol.  If you are breastfeeding, limit how much and how often you drink alcohol.  Limit alcohol intake to no more than 1 drink per day for nonpregnant women. One drink equals 12 ounces of beer, 5 ounces of wine, or 1 ounces of hard liquor.  Do not use street drugs.  Do not share needles.  Ask your health care provider for help if you need support or information about quitting drugs.  Tell your health care provider if you often feel depressed.  Tell your health care provider if you have ever been abused or do not feel safe at home. This information is not intended to replace advice given to you by your health care provider. Make sure you discuss any questions you have with your health care provider. Document Released: 03/04/2011 Document Revised: 01/25/2016 Document Reviewed: 05/23/2015 Elsevier Interactive Patient Education  [employer].

## 2017-06-18 NOTE — Progress Notes (Signed)
Gail Browning 03/09/1986 [id number]    History:    Presents for annual exam. New patient with monthly cycle/BTL. 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 Normal Pap history, last Pap 2016. No Gardasil. Same partner many years. Problem with insurance today requested minimal. Denies gestational diabetes.  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  ROS:  A ROS was performed and pertinent positives and negatives are included.  Exam:  Vitals:   06/18/17 0926  BP: 118/80  Weight: 272 lb (123.4 kg)  Height: 5\' 4"  (1.626 m)   Body mass index is 46.69 kg/m.   General appearance:  Normal Thyroid:  Symmetrical, normal in size, without palpable masses or nodularity. Respiratory  Auscultation:  Clear without wheezing or rhonchi Cardiovascular  Auscultation:  Regular rate, without rubs, murmurs or gallops  Edema/varicosities:  Not grossly evident Abdominal  Soft,nontender, without masses, guarding or rebound.  Liver/spleen:  No organomegaly noted  Hernia:  None appreciated  Skin  Inspection:  Grossly normal   Breasts: Examined lying and sitting.     Right: Without masses, retractions, discharge or axillary adenopathy.     Left: Without masses, retractions, discharge or axillary adenopathy. Gentitourinary   Inguinal/mons:  Normal without inguinal adenopathy  External genitalia:  Normal  BUS/Urethra/Skene's glands:  Normal  Vagina:  Normal  Cervix:  Normal  Uterus:   normal in size, shape and contour.  Midline and mobile  Adnexa/parametria:     Rt: Without masses or tenderness.   Lt: Without masses or tenderness.  Anus and perineum: Normal  Digital rectal exam: Normal sphincter tone without palpated masses or tenderness  Assessment/Plan:  31 y.o. M WF G2 P2 for annual exam with no complaints.  Monthly cycle/BTL HSV 2  rare  outbreaks Obesity  Plan: Will return to the office after insurance issues resolved for CBC and glucose. Instructed to cal for a lab appointment.Will check Pap next year. Zovirax 200 mg 5 times daily for 3-5 days as needed. SBE's, exercise, calcium rich diet, MVI daily encouraged. Reviewed importance of increasing exercise and decreasing calories/carbs for weight loss.    Harrington Challengerancy J Tishawna Larouche [facility] Center[facility]NP, 1:25 PM 06/18/2017

## 2017-07-21 IMAGING — DX DG CHEST 2V
2 series · 2 of 2 positions shown · non-contrast
Comparison: 07/07/2015

CLINICAL DATA: Midsternal tightness and cough, onset last night

EXAM:
CHEST  2 VIEW

[chest pa]
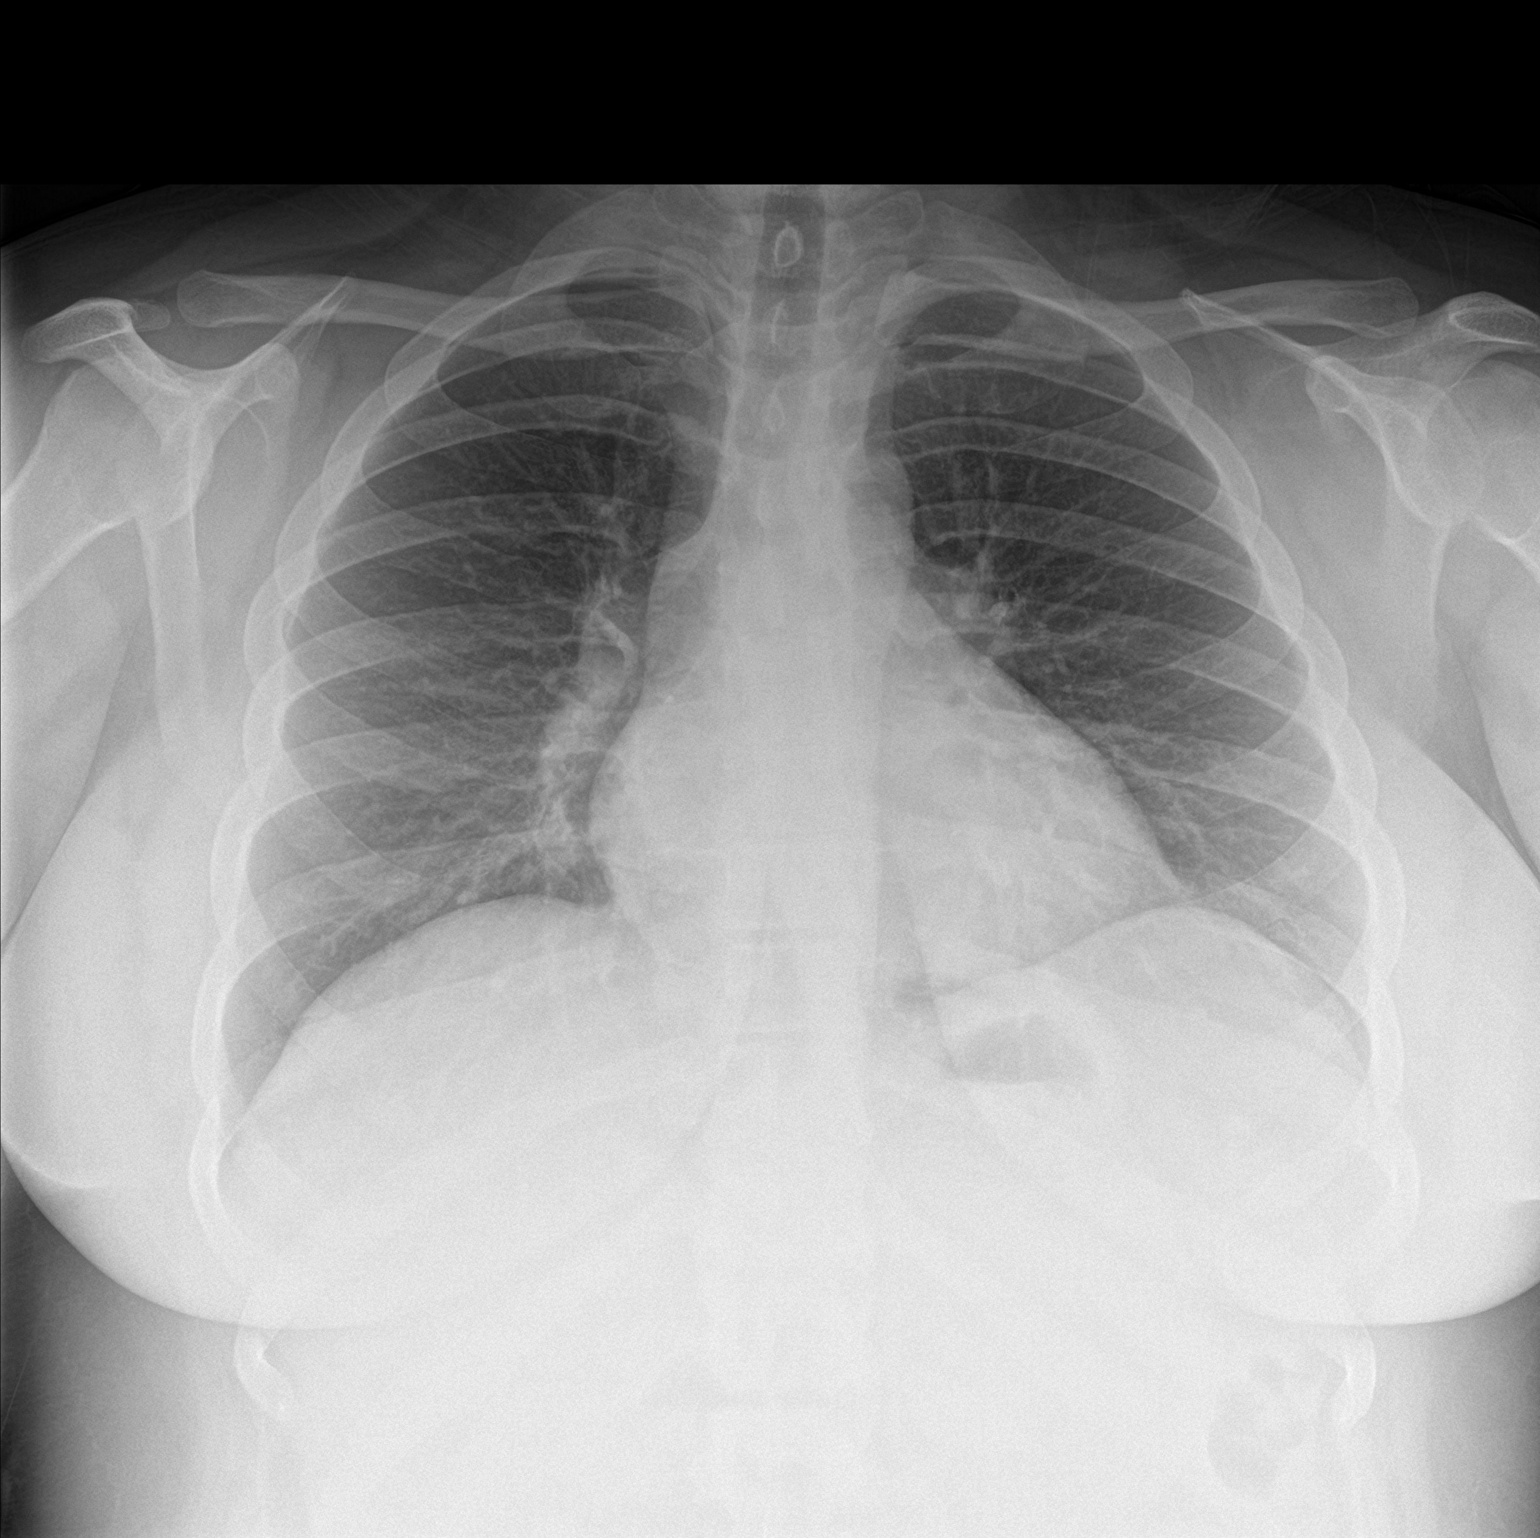

[chest lat]
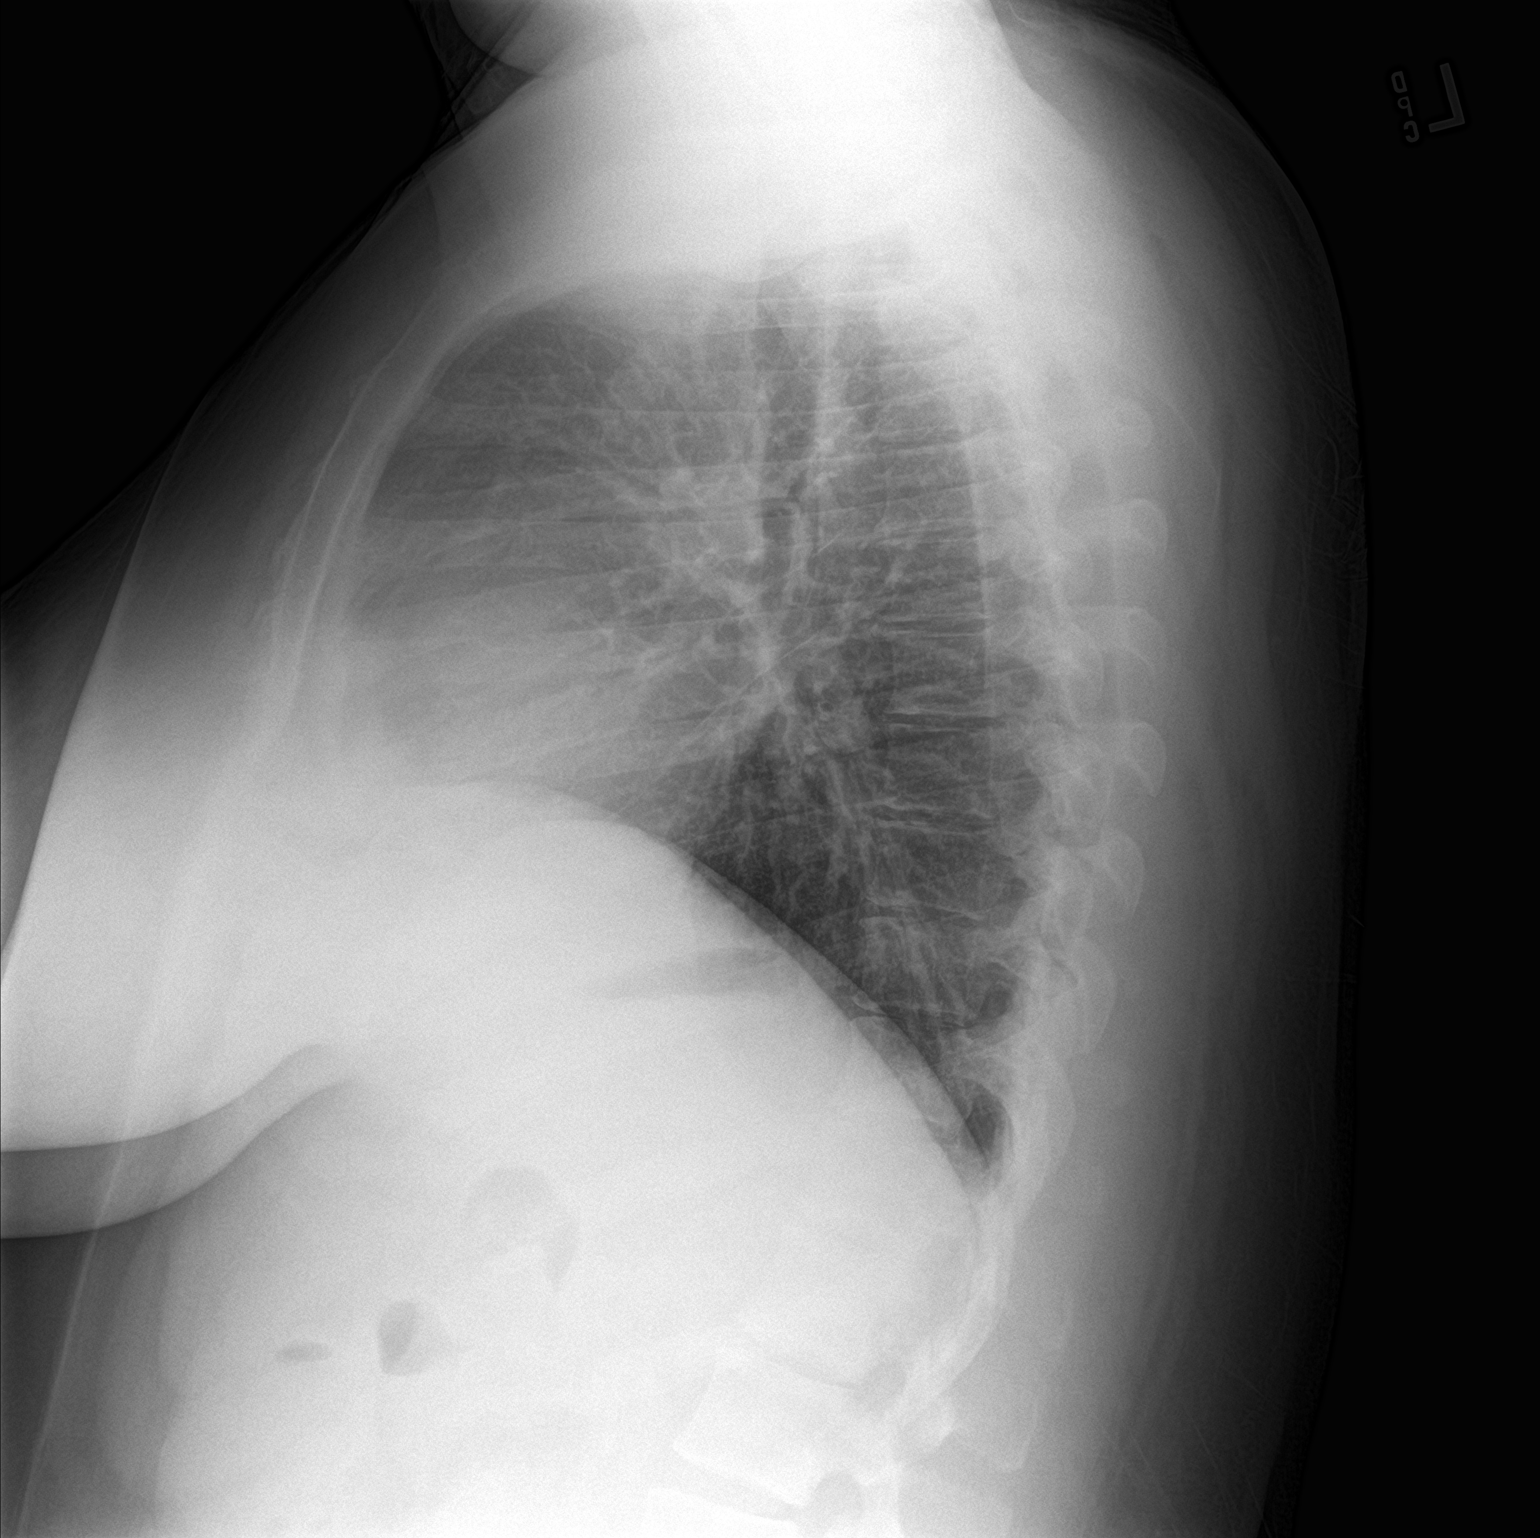

[2 of 2 positions shown; findings below may reference images not displayed]

FINDINGS: The heart size and mediastinal contours are within normal limits.
Both lungs are clear. The visualized skeletal structures are
unremarkable.
IMPRESSION: No active cardiopulmonary disease.

## 2018-05-12 DIAGNOSIS — Z6841 Body Mass Index (BMI) 40.0 and over, adult: Secondary | ICD-10-CM | POA: Diagnosis not present

## 2018-05-12 DIAGNOSIS — J209 Acute bronchitis, unspecified: Secondary | ICD-10-CM | POA: Diagnosis not present

## 2018-06-22 ENCOUNTER — Encounter: Payer: Self-pay | Admitting: Women's Health

## 2018-08-17 ENCOUNTER — Encounter: Payer: Self-pay | Admitting: Women's Health

## 2018-10-05 ENCOUNTER — Ambulatory Visit (INDEPENDENT_AMBULATORY_CARE_PROVIDER_SITE_OTHER): Payer: BLUE CROSS/BLUE SHIELD | Admitting: Women's Health

## 2018-10-05 ENCOUNTER — Encounter: Payer: Self-pay | Admitting: Women's Health

## 2018-10-05 VITALS — BP 124/82 | Ht 64.0 in | Wt 265.0 lb

## 2018-10-05 DIAGNOSIS — B009 Herpesviral infection, unspecified: Secondary | ICD-10-CM

## 2018-10-05 DIAGNOSIS — R87618 Other abnormal cytological findings on specimens from cervix uteri: Secondary | ICD-10-CM | POA: Diagnosis not present

## 2018-10-05 DIAGNOSIS — Z01419 Encounter for gynecological examination (general) (routine) without abnormal findings: Secondary | ICD-10-CM

## 2018-10-05 LAB — CBC WITH DIFFERENTIAL/PLATELET
ABSOLUTE MONOCYTES: 884 {cells}/uL (ref 200–950)
Basophils Absolute: 106 cells/uL (ref 0–200)
Basophils Relative: 0.8 %
EOS ABS: 330 {cells}/uL (ref 15–500)
Eosinophils Relative: 2.5 %
HCT: 38.7 % (ref 35.0–45.0)
HEMOGLOBIN: 12.7 g/dL (ref 11.7–15.5)
Lymphs Abs: 2495 cells/uL (ref 850–3900)
MCH: 28.7 pg (ref 27.0–33.0)
MCHC: 32.8 g/dL (ref 32.0–36.0)
MCV: 87.6 fL (ref 80.0–100.0)
MPV: 10.5 fL (ref 7.5–12.5)
Monocytes Relative: 6.7 %
Neutro Abs: 9385 cells/uL — ABNORMAL HIGH (ref 1500–7800)
Neutrophils Relative %: 71.1 %
Platelets: 347 10*3/uL (ref 140–400)
RBC: 4.42 10*6/uL (ref 3.80–5.10)
RDW: 14.8 % (ref 11.0–15.0)
Total Lymphocyte: 18.9 %
WBC: 13.2 10*3/uL — ABNORMAL HIGH (ref 3.8–10.8)

## 2018-10-05 LAB — GLUCOSE, RANDOM: Glucose, Bld: 60 mg/dL — ABNORMAL LOW (ref 65–99)

## 2018-10-05 MED ORDER — ACYCLOVIR 200 MG PO CAPS: ORAL_CAPSULE | ORAL | 6 refills | Status: DC

## 2018-10-05 NOTE — Addendum Note (Signed)
Addended by: Tito Dine on: 10/05/2018 11:44 AM   Modules accepted: Orders

## 2018-10-05 NOTE — Progress Notes (Signed)
Gail Browning [birth year] [id number]    History:    Presents for annual exam.  Regular monthly cycle/BTL.  History of PCOS with irregular cycles.  Normal Pap history.  HSV-2 no outbreaks.  Did not receive Gardasil.  Past medical history, past surgical history, family history and social history were all reviewed and documented in the EPIC chart.  Daughters are ages 88 and 19 both doing well.  Works at [employer].  ROS:  A ROS was performed and pertinent positives and negatives are included.  Exam:  Vitals:   10/05/18 1048  BP: 124/82  Weight: 265 lb (120.2 kg)  Height: 5\' 4"  (1.626 m)   Body mass index is 45.49 kg/m.   General appearance:  Normal Thyroid:  Symmetrical, normal in size, without palpable masses or nodularity. Respiratory  Auscultation:  Clear without wheezing or rhonchi Cardiovascular  Auscultation:  Regular rate, without rubs, murmurs or gallops  Edema/varicosities:  Not grossly evident Abdominal  Soft,nontender, without masses, guarding or rebound.  Liver/spleen:  No organomegaly noted  Hernia:  None appreciated  Skin  Inspection:  Grossly normal   Breasts: Examined lying and sitting.     Right: Without masses, retractions, discharge or axillary adenopathy.     Left: Without masses, retractions, discharge or axillary adenopathy. Gentitourinary   Inguinal/mons:  Normal without inguinal adenopathy  External genitalia:  Normal  BUS/Urethra/Skene's glands:  Normal  Vagina:  Normal  Cervix:  Normal  Uterus:  normal in size, shape and contour.  Midline and mobile  Adnexa/parametria:     Rt: Without masses or tenderness.   Lt: Without masses or tenderness.  Anus and perineum: Normal  Digital rectal exam: Normal sphincter tone without palpated masses or tenderness  Assessment/Plan:  33 y.o. MWF G2 P2 for annual exam with no complaints.  Monthly cycle/BTL HSV-2 no outbreaks Morbid obesity  Plan: Aware of need to decrease calories/carbs, increase regular  cardio type exercise, 30 minutes most days of the week encouraged.  SBEs, vitamin D 1000 daily encouraged.  CBC, glucose.  Pap with HR HPV typing, new screening guidelines reviewed.    Harrington Challenger [facility], 11:14 AM 10/05/2018

## 2018-10-05 NOTE — Patient Instructions (Signed)
Carbohydrate Counting for Diabetes Mellitus, Adult  Carbohydrate counting is a method of keeping track of how many carbohydrates you eat. Eating carbohydrates naturally increases the amount of sugar (glucose) in the blood. Counting how many carbohydrates you eat helps keep your blood glucose within normal limits, which helps you manage your diabetes (diabetes mellitus). It is important to know how many carbohydrates you can safely have in each meal. This is different for every person. A diet and nutrition specialist (registered dietitian) can help you make a meal plan and calculate how many carbohydrates you should have at each meal and snack. Carbohydrates are found in the following foods:  Grains, such as breads and cereals.  Dried beans and soy products.  Starchy vegetables, such as potatoes, peas, and corn.  Fruit and fruit juices.  Milk and yogurt.  Sweets and snack foods, such as cake, cookies, candy, chips, and soft drinks. How do I count carbohydrates? There are two ways to count carbohydrates in food. You can use either of the methods or a combination of both. Reading "Nutrition Facts" on packaged food The "Nutrition Facts" list is included on the labels of almost all packaged foods and beverages in the [place] It includes:  The serving size.  Information about nutrients in each serving, including the grams (g) of carbohydrate per serving. To use the "Nutrition Facts":  Decide how many servings you will have.  Multiply the number of servings by the number of carbohydrates per serving.  The resulting number is the total amount of carbohydrates that you will be having. Learning standard serving sizes of other foods When you eat carbohydrate foods that are not packaged or do not include "Nutrition Facts" on the label, you need to measure the servings in order to count the amount of carbohydrates:  Measure the foods that you will eat with a food scale or measuring cup, if  needed.  Decide how many standard-size servings you will eat.  Multiply the number of servings by 15. Most carbohydrate-rich foods have about 15 g of carbohydrates per serving. ? For example, if you eat 8 oz (170 g) of strawberries, you will have eaten 2 servings and 30 g of carbohydrates (2 servings x 15 g = 30 g).  For foods that have more than one food mixed, such as soups and casseroles, you must count the carbohydrates in each food that is included. The following list contains standard serving sizes of common carbohydrate-rich foods. Each of these servings has about 15 g of carbohydrates:   hamburger bun or  English muffin.   oz (15 mL) syrup.   oz (14 g) jelly.  1 slice of bread.  1 six-inch tortilla.  3 oz (85 g) cooked rice or pasta.  4 oz (113 g) cooked dried beans.  4 oz (113 g) starchy vegetable, such as peas, corn, or potatoes.  4 oz (113 g) hot cereal.  4 oz (113 g) mashed potatoes or  of a large baked potato.  4 oz (113 g) canned or frozen fruit.  4 oz (120 mL) fruit juice.  4-6 crackers.  6 chicken nuggets.  6 oz (170 g) unsweetened dry cereal.  6 oz (170 g) plain fat-free yogurt or yogurt sweetened with artificial sweeteners.  8 oz (240 mL) milk.  8 oz (170 g) fresh fruit or one small piece of fruit.  24 oz (680 g) popped popcorn. Example of carbohydrate counting Sample meal  3 oz (85 g) chicken breast.  6 oz (170 g)  brown rice.  4 oz (113 g) corn.  8 oz (240 mL) milk.  8 oz (170 g) strawberries with sugar-free whipped topping. Carbohydrate calculation 1. Identify the foods that contain carbohydrates: ? Rice. ? Corn. ? Milk. ? Strawberries. 2. Calculate how many servings you have of each food: ? 2 servings rice. ? 1 serving corn. ? 1 serving milk. ? 1 serving strawberries. 3. Multiply each number of servings by 15 g: ? 2 servings rice x 15 g = 30 g. ? 1 serving corn x 15 g = 15 g. ? 1 serving milk x 15 g = 15 g. ? 1  serving strawberries x 15 g = 15 g. 4. Add together all of the amounts to find the total grams of carbohydrates eaten: ? 30 g + 15 g + 15 g + 15 g = 75 g of carbohydrates total. Summary  Carbohydrate counting is a method of keeping track of how many carbohydrates you eat.  Eating carbohydrates naturally increases the amount of sugar (glucose) in the blood.  Counting how many carbohydrates you eat helps keep your blood glucose within normal limits, which helps you manage your diabetes.  A diet and nutrition specialist (registered dietitian) can help you make a meal plan and calculate how many carbohydrates you should have at each meal and snack. This information is not intended to replace advice given to you by your health care provider. Make sure you discuss any questions you have with your health care provider. Document Released: 08/19/2005 Document Revised: 02/26/2017 Document Reviewed: 01/31/2016 Elsevier Interactive Patient Education  2019 [employer] Maintenance, Female Adopting a healthy lifestyle and getting preventive care can go a long way to promote health and wellness. Talk with your health care provider about what schedule of regular examinations is right for you. This is a good chance for you to check in with your provider about disease prevention and staying healthy. In between checkups, there are plenty of things you can do on your own. Experts have done a lot of research about which lifestyle changes and preventive measures are most likely to keep you healthy. Ask your health care provider for more information. Weight and diet Eat a healthy diet  Be sure to include plenty of vegetables, fruits, low-fat dairy products, and lean protein.  Do not eat a lot of foods high in solid fats, added sugars, or salt.  Get regular exercise. This is one of the most important things you can do for your health. ? Most adults should exercise for at least 150 minutes each week. The  exercise should increase your heart rate and make you sweat (moderate-intensity exercise). ? Most adults should also do strengthening exercises at least twice a week. This is in addition to the moderate-intensity exercise. Maintain a healthy weight  Body mass index (BMI) is a measurement that can be used to identify possible weight problems. It estimates body fat based on height and weight. Your health care provider can help determine your BMI and help you achieve or maintain a healthy weight.  For females 80 years of age and older: ? A BMI below 18.5 is considered underweight. ? A BMI of 18.5 to 24.9 is normal. ? A BMI of 25 to 29.9 is considered overweight. ? A BMI of 30 and above is considered obese. Watch levels of cholesterol and blood lipids  You should start having your blood tested for lipids and cholesterol at 31 years of age, then have this test every 5  years.  You may need to have your cholesterol levels checked more often if: ? Your lipid or cholesterol levels are high. ? You are older than 31 years of age. ? You are at high risk for heart disease. Cancer screening Lung Cancer  Lung cancer screening is recommended for adults 30-13 years old who are at high risk for lung cancer because of a history of smoking.  A yearly low-dose CT scan of the lungs is recommended for people who: ? Currently smoke. ? Have quit within the past 15 years. ? Have at least a 30-pack-year history of smoking. A pack year is smoking an average of one pack of cigarettes a day for 1 year.  Yearly screening should continue until it has been 15 years since you quit.  Yearly screening should stop if you develop a health problem that would prevent you from having lung cancer treatment. Breast Cancer  Practice breast self-awareness. This means understanding how your breasts normally appear and feel.  It also means doing regular breast self-exams. Let your health care provider know about any changes,  no matter how small.  If you are in your 20s or 30s, you should have a clinical breast exam (CBE) by a health care provider every 1-3 years as part of a regular health exam.  If you are 73 or older, have a CBE every year. Also consider having a breast X-ray (mammogram) every year.  If you have a family history of breast cancer, talk to your health care provider about genetic screening.  If you are at high risk for breast cancer, talk to your health care provider about having an MRI and a mammogram every year.  Breast cancer gene (BRCA) assessment is recommended for women who have family members with BRCA-related cancers. BRCA-related cancers include: ? Breast. ? Ovarian. ? Tubal. ? Peritoneal cancers.  Results of the assessment will determine the need for genetic counseling and BRCA1 and BRCA2 testing. Cervical Cancer Your health care provider may recommend that you be screened regularly for cancer of the pelvic organs (ovaries, uterus, and vagina). This screening involves a pelvic examination, including checking for microscopic changes to the surface of your cervix (Pap test). You may be encouraged to have this screening done every 3 years, beginning at age 51.  For women ages 31-65, health care providers may recommend pelvic exams and Pap testing every 3 years, or they may recommend the Pap and pelvic exam, combined with testing for human papilloma virus (HPV), every 5 years. Some types of HPV increase your risk of cervical cancer. Testing for HPV may also be done on women of any age with unclear Pap test results.  Other health care providers may not recommend any screening for nonpregnant women who are considered low risk for pelvic cancer and who do not have symptoms. Ask your health care provider if a screening pelvic exam is right for you.  If you have had past treatment for cervical cancer or a condition that could lead to cancer, you need Pap tests and screening for cancer for at  least 20 years after your treatment. If Pap tests have been discontinued, your risk factors (such as having a new sexual partner) need to be reassessed to determine if screening should resume. Some women have medical problems that increase the chance of getting cervical cancer. In these cases, your health care provider may recommend more frequent screening and Pap tests. Colorectal Cancer  This type of cancer can be detected and often  prevented.  Routine colorectal cancer screening usually begins at 31 years of age and continues through 31 years of age.  Your health care provider may recommend screening at an earlier age if you have risk factors for colon cancer.  Your health care provider may also recommend using home test kits to check for hidden blood in the stool.  A small camera at the end of a tube can be used to examine your colon directly (sigmoidoscopy or colonoscopy). This is done to check for the earliest forms of colorectal cancer.  Routine screening usually begins at age 67.  Direct examination of the colon should be repeated every 5-10 years through 31 years of age. However, you may need to be screened more often if early forms of precancerous polyps or small growths are found. Skin Cancer  Check your skin from head to toe regularly.  Tell your health care provider about any new moles or changes in moles, especially if there is a change in a mole's shape or color.  Also tell your health care provider if you have a mole that is larger than the size of a pencil eraser.  Always use sunscreen. Apply sunscreen liberally and repeatedly throughout the day.  Protect yourself by wearing long sleeves, pants, a wide-brimmed hat, and sunglasses whenever you are outside. Heart disease, diabetes, and high blood pressure  High blood pressure causes heart disease and increases the risk of stroke. High blood pressure is more likely to develop in: ? People who have blood pressure in the  high end of the normal range (130-139/85-89 mm Hg). ? People who are overweight or obese. ? People who are African American.  If you are 27-33 years of age, have your blood pressure checked every 3-5 years. If you are 16 years of age or older, have your blood pressure checked every year. You should have your blood pressure measured twice-once when you are at a hospital or clinic, and once when you are not at a hospital or clinic. Record the average of the two measurements. To check your blood pressure when you are not at a hospital or clinic, you can use: ? An automated blood pressure machine at a pharmacy. ? A home blood pressure monitor.  If you are between 54 years and 65 years old, ask your health care provider if you should take aspirin to prevent strokes.  Have regular diabetes screenings. This involves taking a blood sample to check your fasting blood sugar level. ? If you are at a normal weight and have a low risk for diabetes, have this test once every three years after 31 years of age. ? If you are overweight and have a high risk for diabetes, consider being tested at a younger age or more often. Preventing infection Hepatitis B  If you have a higher risk for hepatitis B, you should be screened for this virus. You are considered at high risk for hepatitis B if: ? You were born in a country where hepatitis B is common. Ask your health care provider which countries are considered high risk. ? Your parents were born in a high-risk country, and you have not been immunized against hepatitis B (hepatitis B vaccine). ? You have HIV or AIDS. ? You use needles to inject street drugs. ? You live with someone who has hepatitis B. ? You have had sex with someone who has hepatitis B. ? You get hemodialysis treatment. ? You take certain medicines for conditions, including cancer, organ transplantation,  and autoimmune conditions. Hepatitis C  Blood testing is recommended for: ? Everyone born  from [birth year] through 1963. ? Anyone with known risk factors for hepatitis C. Sexually transmitted infections (STIs)  You should be screened for sexually transmitted infections (STIs) including gonorrhea and chlamydia if: ? You are sexually active and are younger than 31 years of age. ? You are older than 31 years of age and your health care provider tells you that you are at risk for this type of infection. ? Your sexual activity has changed since you were last screened and you are at an increased risk for chlamydia or gonorrhea. Ask your health care provider if you are at risk.  If you do not have HIV, but are at risk, it may be recommended that you take a prescription medicine daily to prevent HIV infection. This is called pre-exposure prophylaxis (PrEP). You are considered at risk if: ? You are sexually active and do not regularly use condoms or know the HIV status of your partner(s). ? You take drugs by injection. ? You are sexually active with a partner who has HIV. Talk with your health care provider about whether you are at high risk of being infected with HIV. If you choose to begin PrEP, you should first be tested for HIV. You should then be tested every 3 months for as long as you are taking PrEP. Pregnancy  If you are premenopausal and you may become pregnant, ask your health care provider about preconception counseling.  If you may become pregnant, take 400 to 800 micrograms (mcg) of folic acid every day.  If you want to prevent pregnancy, talk to your health care provider about birth control (contraception). Osteoporosis and menopause  Osteoporosis is a disease in which the bones lose minerals and strength with aging. This can result in serious bone fractures. Your risk for osteoporosis can be identified using a bone density scan.  If you are 19 years of age or older, or if you are at risk for osteoporosis and fractures, ask your health care provider if you should be  screened.  Ask your health care provider whether you should take a calcium or vitamin D supplement to lower your risk for osteoporosis.  Menopause may have certain physical symptoms and risks.  Hormone replacement therapy may reduce some of these symptoms and risks. Talk to your health care provider about whether hormone replacement therapy is right for you. Follow these instructions at home:  Schedule regular health, dental, and eye exams.  Stay current with your immunizations.  Do not use any tobacco products including cigarettes, chewing tobacco, or electronic cigarettes.  If you are pregnant, do not drink alcohol.  If you are breastfeeding, limit how much and how often you drink alcohol.  Limit alcohol intake to no more than 1 drink per day for nonpregnant women. One drink equals 12 ounces of beer, 5 ounces of wine, or 1 ounces of hard liquor.  Do not use street drugs.  Do not share needles.  Ask your health care provider for help if you need support or information about quitting drugs.  Tell your health care provider if you often feel depressed.  Tell your health care provider if you have ever been abused or do not feel safe at home. This information is not intended to replace advice given to you by your health care provider. Make sure you discuss any questions you have with your health care provider. Document Released: 03/04/2011 Document Revised: 01/25/2016 Document  Reviewed: 05/23/2015 Elsevier Interactive Patient Education  [employer].

## 2018-10-06 LAB — PAP, TP IMAGING W/ HPV RNA, RFLX HPV TYPE 16,18/45: HPV DNA High Risk: NOT DETECTED

## 2018-10-07 ENCOUNTER — Other Ambulatory Visit: Payer: Self-pay | Admitting: Women's Health

## 2018-10-21 NOTE — Telephone Encounter (Signed)
Called and read patient the My Chart email.

## 2019-07-08 DIAGNOSIS — L7211 Pilar cyst: Secondary | ICD-10-CM | POA: Diagnosis not present

## 2019-10-11 ENCOUNTER — Other Ambulatory Visit: Payer: Self-pay

## 2019-10-12 ENCOUNTER — Encounter: Payer: Self-pay | Admitting: Women's Health

## 2019-10-12 ENCOUNTER — Ambulatory Visit (INDEPENDENT_AMBULATORY_CARE_PROVIDER_SITE_OTHER): Payer: BC Managed Care – PPO | Admitting: Women's Health

## 2019-10-12 VITALS — BP 120/82 | Ht 64.0 in | Wt 271.0 lb

## 2019-10-12 DIAGNOSIS — Z01419 Encounter for gynecological examination (general) (routine) without abnormal findings: Secondary | ICD-10-CM | POA: Diagnosis not present

## 2019-10-12 DIAGNOSIS — R635 Abnormal weight gain: Secondary | ICD-10-CM

## 2019-10-12 DIAGNOSIS — B009 Herpesviral infection, unspecified: Secondary | ICD-10-CM | POA: Diagnosis not present

## 2019-10-12 MED ORDER — ACYCLOVIR 200 MG PO CAPS
ORAL_CAPSULE | ORAL | 6 refills | Status: AC
Start: 2019-10-12 — End: ?

## 2019-10-12 NOTE — Patient Instructions (Addendum)
Bupropion sustained-release tablets (smoking cessation) What is this medicine? BUPROPION (byoo PROE pee on) is used to help people quit smoking. This medicine may be used for other purposes; ask your health care provider or pharmacist if you have questions. COMMON BRAND NAME(S): Buproban, Zyban What should I tell my health care provider before I take this medicine? They need to know if you have any of these conditions:  an eating disorder, such as anorexia or bulimia  bipolar disorder or psychosis  diabetes or high blood sugar, treated with medication  glaucoma  head injury or brain tumor  heart disease, previous heart attack, or irregular heart beat  high blood pressure  kidney or liver disease  seizures  suicidal thoughts or a previous suicide attempt  Tourette's syndrome  weight loss  an unusual or allergic reaction to bupropion, other medicines, foods, dyes, or preservatives  breast-feeding  pregnant or trying to become pregnant How should I use this medicine? Take this medicine by mouth with a glass of water. Follow the directions on the prescription label. You can take it with or without food. If it upsets your stomach, take it with food. Do not cut, crush or chew this medicine. Take your medicine at regular intervals. If you take this medicine more than once a day, take your second dose at least 8 hours after you take your first dose. To limit difficulty in sleeping, avoid taking this medicine at bedtime. Do not take your medicine more often than directed. Do not stop taking this medicine suddenly except upon the advice of your doctor. Stopping this medicine too quickly may cause serious side effects. A special MedGuide will be given to you by the pharmacist with each prescription and refill. Be sure to read this information carefully each time. Talk to your pediatrician regarding the use of this medicine in children. Special care may be needed. Overdosage: If you  think you have taken too much of this medicine contact a poison control center or emergency room at once. NOTE: This medicine is only for you. Do not share this medicine with others. What if I miss a dose? If you miss a dose, skip the missed dose and take your next tablet at the regular time. There should be at least 8 hours between doses. Do not take double or extra doses. What may interact with this medicine? Do not take this medicine with any of the following medications:  linezolid  MAOIs like Azilect, Carbex, Eldepryl, Marplan, Nardil, and Parnate  methylene blue (injected into a vein)  other medicines that contain bupropion like Wellbutrin This medicine may also interact with the following medications:  alcohol  certain medicines for anxiety or sleep  certain medicines for blood pressure like metoprolol, propranolol  certain medicines for depression or psychotic disturbances  certain medicines for HIV or AIDS like efavirenz, lopinavir, nelfinavir, ritonavir  certain medicines for irregular heart beat like propafenone, flecainide  certain medicines for Parkinson's disease like amantadine, levodopa  certain medicines for seizures like carbamazepine, phenytoin, phenobarbital  cimetidine  clopidogrel  cyclophosphamide  digoxin  furazolidone  isoniazid  nicotine  orphenadrine  procarbazine  steroid medicines like prednisone or cortisone  stimulant medicines for attention disorders, weight loss, or to stay awake  tamoxifen  theophylline  thiotepa  ticlopidine  tramadol  warfarin This list may not describe all possible interactions. Give your health care provider a list of all the medicines, herbs, non-prescription drugs, or dietary supplements you use. Also tell them if you  smoke, drink alcohol, or use illegal drugs. Some items may interact with your medicine. What should I watch for while using this medicine? Visit your doctor or healthcare provider  for regular checks on your progress. This medicine should be used together with a patient support program. It is important to participate in a behavioral program, counseling, or other support program that is recommended by your healthcare provider. This medicine may cause serious skin reactions. They can happen weeks to months after starting the medicine. Contact your healthcare provider right away if you notice fevers or flu-like symptoms with a rash. The rash may be red or purple and then turn into blisters or peeling of the skin. Or, you might notice a red rash with swelling of the face, lips or lymph nodes in your neck or under your arms. Patients and their families should watch out for new or worsening thoughts of suicide or depression. Also watch out for sudden changes in feelings such as feeling anxious, agitated, panicky, irritable, hostile, aggressive, impulsive, severely restless, overly excited and hyperactive, or not being able to sleep. If this happens, especially at the beginning of treatment or after a change in dose, call your healthcare provider. Avoid alcoholic drinks while taking this medicine. Drinking excessive alcoholic beverages, using sleeping or anxiety medicines, or quickly stopping the use of these agents while taking this medicine may increase your risk for a seizure. Do not drive or use heavy machinery until you know how this medicine affects you. This medicine can impair your ability to perform these tasks. Do not take this medicine close to bedtime. It may prevent you from sleeping. Your mouth may get dry. Chewing sugarless gum or sucking hard candy, and drinking plenty of water may help. Contact your doctor if the problem does not go away or is severe. Do not use nicotine patches or chewing gum without the advice of your doctor or healthcare provider while taking this medicine. You may need to have your blood pressure taken regularly if your doctor recommends that you use both  nicotine and this medicine together. What side effects may I notice from receiving this medicine? Side effects that you should report to your doctor or health care professional as soon as possible:  allergic reactions like skin rash, itching or hives, swelling of the face, lips, or tongue  breathing problems  changes in vision  confusion  elevated mood, decreased need for sleep, racing thoughts, impulsive behavior  fast or irregular heartbeat  hallucinations, loss of contact with reality  increased blood pressure  rash, fever, and swollen lymph nodes  redness, blistering, peeling, or loosening of the skin, including inside the mouth  seizures  suicidal thoughts or other mood changes  unusually weak or tired  vomiting Side effects that usually do not require medical attention (report to your doctor or health care professional if they continue or are bothersome):  constipation  headache  loss of appetite  nausea  tremors  weight loss This list may not describe all possible side effects. Call your doctor for medical advice about side effects. You may report side effects to FDA at 1-800-FDA-1088. Where should I keep my medicine? Keep out of the reach of children. Store at room temperature between 20 and 25 degrees C (68 and 77 degrees F). Protect from light. Keep container tightly closed. Throw away any unused medicine after the expiration date. NOTE: This sheet is a summary. It may not cover all possible information. If you have questions about this  medicine, talk to your doctor, pharmacist, or health care provider.  2020 Elsevier/Gold Standard (2018-11-12 13:59:09) Vit D 1000 iu daily  Coping with Quitting Smoking  Quitting smoking is a physical and mental challenge. You will face cravings, withdrawal symptoms, and temptation. Before quitting, work with your health care provider to make a plan that can help you cope. Preparation can help you quit and keep you from  giving in. How can I cope with cravings? Cravings usually last for 5-10 minutes. If you get through it, the craving will pass. Consider taking the following actions to help you cope with cravings:  Keep your mouth busy: ? Chew sugar-free gum. ? Suck on hard candies or a straw. ? Brush your teeth.  Keep your hands and body busy: ? Immediately change to a different activity when you feel a craving. ? Squeeze or play with a ball. ? Do an activity or a hobby, like making bead jewelry, practicing needlepoint, or working with wood. ? Mix up your normal routine. ? Take a short exercise break. Go for a quick walk or run up and down stairs. ? Spend time in public places where smoking is not allowed.  Focus on doing something kind or helpful for someone else.  Call a friend or family member to talk during a craving.  Join a support group.  Call a quit line, such as 1-800-QUIT-NOW.  Talk with your health care provider about medicines that might help you cope with cravings and make quitting easier for you. How can I deal with withdrawal symptoms? Your body may experience negative effects as it tries to get used to not having nicotine in the system. These effects are called withdrawal symptoms. They may include:  Feeling hungrier than normal.  Trouble concentrating.  Irritability.  Trouble sleeping.  Feeling depressed.  Restlessness and agitation.  Craving a cigarette. To manage withdrawal symptoms:  Avoid places, people, and activities that trigger your cravings.  Remember why you want to quit.  Get plenty of sleep.  Avoid coffee and other caffeinated drinks. These may worsen some of your symptoms. How can I handle social situations? Social situations can be difficult when you are quitting smoking, especially in the first few weeks. To manage this, you can:  Avoid parties, bars, and other social situations where people might be smoking.  Avoid alcohol.  Leave right away  if you have the urge to smoke.  Explain to your family and friends that you are quitting smoking. Ask for understanding and support.  Plan activities with friends or family where smoking is not an option. What are some ways I can cope with stress? Wanting to smoke may cause stress, and stress can make you want to smoke. Find ways to manage your stress. Relaxation techniques can help. For example:  Breathe slowly and deeply, in through your nose and out through your mouth.  Listen to soothing, relaxing music.  Talk with a family member or friend about your stress.  Light a candle.  Soak in a bath or take a shower.  Think about a peaceful place. What are some ways I can prevent weight gain? Be aware that many people gain weight after they quit smoking. However, not everyone does. To keep from gaining weight, have a plan in place before you quit and stick to the plan after you quit. Your plan should include:  Having healthy snacks. When you have a craving, it may help to: ? Eat plain popcorn, crunchy carrots, celery, or other  cut vegetables. ? Chew sugar-free gum.  Changing how you eat: ? Eat small portion sizes at meals. ? Eat 4-6 small meals throughout the day instead of 1-2 large meals a day. ? Be mindful when you eat. Do not watch television or do other things that might distract you as you eat.  Exercising regularly: ? Make time to exercise each day. If you do not have time for a long workout, do short bouts of exercise for 5-10 minutes several times a day. ? Do some form of strengthening exercise, like weight lifting, and some form of aerobic exercise, like running or swimming.  Drinking plenty of water or other low-calorie or no-calorie drinks. Drink 6-8 glasses of water daily, or as much as instructed by your health care provider. Summary  Quitting smoking is a physical and mental challenge. You will face cravings, withdrawal symptoms, and temptation to smoke again.  Preparation can help you as you go through these challenges.  You can cope with cravings by keeping your mouth busy (such as by chewing gum), keeping your body and hands busy, and making calls to family, friends, or a helpline for people who want to quit smoking.  You can cope with withdrawal symptoms by avoiding places where people smoke, avoiding drinks with caffeine, and getting plenty of rest.  Ask your health care provider about the different ways to prevent weight gain, avoid stress, and handle social situations. This information is not intended to replace advice given to you by your health care provider. Make sure you discuss any questions you have with your health care provider. Document Revised: 08/01/2017 Document Reviewed: 08/16/2016 Elsevier Patient Education  2020 Elsevier Inc[employer] 000 iu daily Good to see you today Health Maintenance, Female Adopting a healthy lifestyle and getting preventive care are important in promoting health and wellness. Ask your health care provider about:  The right schedule for you to have regular tests and exams.  Things you can do on your own to prevent diseases and keep yourself healthy. What should I know about diet, weight, and exercise? Eat a healthy diet   Eat a diet that includes plenty of vegetables, fruits, low-fat dairy products, and lean protein.  Do not eat a lot of foods that are high in solid fats, added sugars, or sodium. Maintain a healthy weight Body mass index (BMI) is used to identify weight problems. It estimates body fat based on height and weight. Your health care provider can help determine your BMI and help you achieve or maintain a healthy weight. Get regular exercise Get regular exercise. This is one of the most important things you can do for your health. Most adults should:  Exercise for at least 150 minutes each week. The exercise should increase your heart rate and make you sweat (moderate-intensity exercise).  Do  strengthening exercises at least twice a week. This is in addition to the moderate-intensity exercise.  Spend less time sitting. Even light physical activity can be beneficial. Watch cholesterol and blood lipids Have your blood tested for lipids and cholesterol at 32 years of age, then have this test every 5 years. Have your cholesterol levels checked more often if:  Your lipid or cholesterol levels are high.  You are older than 32 years of age.  You are at high risk for heart disease. What should I know about cancer screening? Depending on your health history and family history, you may need to have cancer screening at various ages. This may include screening for:  Breast cancer.  Cervical cancer.  Colorectal cancer.  Skin cancer.  Lung cancer. What should I know about heart disease, diabetes, and high blood pressure? Blood pressure and heart disease  High blood pressure causes heart disease and increases the risk of stroke. This is more likely to develop in people who have high blood pressure readings, are of African descent, or are overweight.  Have your blood pressure checked: ? Every 3-5 years if you are 17-34 years of age. ? Every year if you are 79 years old or older. Diabetes Have regular diabetes screenings. This checks your fasting blood sugar level. Have the screening done:  Once every three years after age 49 if you are at a normal weight and have a low risk for diabetes.  More often and at a younger age if you are overweight or have a high risk for diabetes. What should I know about preventing infection? Hepatitis B If you have a higher risk for hepatitis B, you should be screened for this virus. Talk with your health care provider to find out if you are at risk for hepatitis B infection. Hepatitis C Testing is recommended for:  Everyone born from [birth year] through 1963.  Anyone with known risk factors for hepatitis C. Sexually transmitted infections  (STIs)  Get screened for STIs, including gonorrhea and chlamydia, if: ? You are sexually active and are younger than 32 years of age. ? You are older than 32 years of age and your health care provider tells you that you are at risk for this type of infection. ? Your sexual activity has changed since you were last screened, and you are at increased risk for chlamydia or gonorrhea. Ask your health care provider if you are at risk.  Ask your health care provider about whether you are at high risk for HIV. Your health care provider may recommend a prescription medicine to help prevent HIV infection. If you choose to take medicine to prevent HIV, you should first get tested for HIV. You should then be tested every 3 months for as long as you are taking the medicine. Pregnancy  If you are about to stop having your period (premenopausal) and you may become pregnant, seek counseling before you get pregnant.  Take 400 to 800 micrograms (mcg) of folic acid every day if you become pregnant.  Ask for birth control (contraception) if you want to prevent pregnancy. Osteoporosis and menopause Osteoporosis is a disease in which the bones lose minerals and strength with aging. This can result in bone fractures. If you are 39 years old or older, or if you are at risk for osteoporosis and fractures, ask your health care provider if you should:  Be screened for bone loss.  Take a calcium or vitamin D supplement to lower your risk of fractures.  Be given hormone replacement therapy (HRT) to treat symptoms of menopause. Follow these instructions at home: Lifestyle  Do not use any products that contain nicotine or tobacco, such as cigarettes, e-cigarettes, and chewing tobacco. If you need help quitting, ask your health care provider.  Do not use street drugs.  Do not share needles.  Ask your health care provider for help if you need support or information about quitting drugs. Alcohol use  Do not drink  alcohol if: ? Your health care provider tells you not to drink. ? You are pregnant, may be pregnant, or are planning to become pregnant.  If you drink alcohol: ? Limit how much you use to  0-1 drink a day. ? Limit intake if you are breastfeeding.  Be aware of how much alcohol is in your drink. In the [place] one drink equals one 12 oz bottle of beer (355 mL), one 5 oz glass of wine (148 mL), or one 1 oz glass of hard liquor (44 mL). General instructions  Schedule regular health, dental, and eye exams.  Stay current with your vaccines.  Tell your health care provider if: ? You often feel depressed. ? You have ever been abused or do not feel safe at home. Summary  Adopting a healthy lifestyle and getting preventive care are important in promoting health and wellness.  Follow your health care provider's instructions about healthy diet, exercising, and getting tested or screened for diseases.  Follow your health care provider's instructions on monitoring your cholesterol and blood pressure. This information is not intended to replace advice given to you by your health care provider. Make sure you discuss any questions you have with your health care provider. Document Revised: 08/12/2018 Document Reviewed: 08/12/2018 Elsevier Patient Education  2020 [employer].

## 2019-10-12 NOTE — Progress Notes (Signed)
Gail Browning 32-31-1987 [id number]    History:    Presents for annual exam.  Regular monthly cycle/BTL.  History of PCOS and HSV with rare outbreaks.  Normal Pap history.  Thinks received Gardasil.  Smoker 5 to 6 cigarettes daily  Past medical history, past surgical history, family history and social history were all reviewed and documented in the EPIC chart.  Works at [employer], daughters are 8 and 6 both doing well.  Father hypertension, prostate cancer, mother healthy.  ROS:  A ROS was performed and pertinent positives and negatives are included.  Exam:  Vitals:   10/12/19 1022  BP: 120/82  Weight: 271 lb (122.9 kg)  Height: 5\' 4"  (1.626 m)   Body mass index is 46.52 kg/m.   General appearance:  Normal Thyroid:  Symmetrical, normal in size, without palpable masses or nodularity. Respiratory  Auscultation:  Clear without wheezing or rhonchi Cardiovascular  Auscultation:  Regular rate, without rubs, murmurs or gallops  Edema/varicosities:  Not grossly evident Abdominal  Soft,nontender, without masses, guarding or rebound.  Liver/spleen:  No organomegaly noted  Hernia:  None appreciated  Skin  Inspection:  Grossly normal   Breasts: Examined lying and sitting.     Right: Without masses, retractions, discharge or axillary adenopathy.     Left: Without masses, retractions, discharge or axillary adenopathy. Gentitourinary   Inguinal/mons:  Normal without inguinal adenopathy  External genitalia:  Normal  BUS/Urethra/Skene's glands:  Normal  Vagina:  Normal  Cervix:  Normal  Uterus: normal in size, shape and contour.  Midline and mobile  Adnexa/parametria:     Rt: Without masses or tenderness.   Lt: Without masses or tenderness.  Anus and perineum: Normal  Digital rectal exam: Normal sphincter tone without palpated masses or tenderness  Assessment/Plan:  34 y.o. MWF G2 P2 for annual exam with no complaints of vaginal discharge, urinary symptoms, GI problems or abdominal  pain.  Monthly cycle with some menstrual cramping/BTL Smoker less than half pack daily Obesity HSV-2 rare outbreaks  Plan: Zovirax 200 mg 5 times daily for 3 to 5 days as needed prescription given.  SBEs, exercise, calcium rich foods, vitamin D 1000 IUs daily encouraged.  Aware of importance of increasing exercise and decreasing calories/carbs.  Tips for quitting smoking discussed.  Motrin as needed for menstrual cramping.  Pap normal 2020, new screening guidelines reviewed.  CBC, CMP, TSH.  Reports having difficulty losing weight despite cutting calories and increasing exercise.    [facility], 10:44 AM 10/12/2019

## 2019-10-13 LAB — COMPREHENSIVE METABOLIC PANEL
AG Ratio: 1.5 (calc) (ref 1.0–2.5)
ALT: 32 U/L — ABNORMAL HIGH (ref 6–29)
AST: 16 U/L (ref 10–30)
Albumin: 4.3 g/dL (ref 3.6–5.1)
Alkaline phosphatase (APISO): 119 U/L (ref 31–125)
BUN: 10 mg/dL (ref 7–25)
CO2: 21 mmol/L (ref 20–32)
Calcium: 9.3 mg/dL (ref 8.6–10.2)
Chloride: 107 mmol/L (ref 98–110)
Creat: 0.62 mg/dL (ref 0.50–1.10)
Globulin: 2.9 g/dL (calc) (ref 1.9–3.7)
Glucose, Bld: 73 mg/dL (ref 65–99)
Potassium: 4 mmol/L (ref 3.5–5.3)
Sodium: 138 mmol/L (ref 135–146)
Total Bilirubin: 0.4 mg/dL (ref 0.2–1.2)
Total Protein: 7.2 g/dL (ref 6.1–8.1)

## 2019-10-13 LAB — CBC WITH DIFFERENTIAL/PLATELET
Absolute Monocytes: 1026 cells/uL — ABNORMAL HIGH (ref 200–950)
Basophils Absolute: 95 cells/uL (ref 0–200)
Basophils Relative: 0.7 %
Eosinophils Absolute: 405 cells/uL (ref 15–500)
Eosinophils Relative: 3 %
HCT: 42.8 % (ref 35.0–45.0)
Hemoglobin: 14 g/dL (ref 11.7–15.5)
Lymphs Abs: 2957 cells/uL (ref 850–3900)
MCH: 29.5 pg (ref 27.0–33.0)
MCHC: 32.7 g/dL (ref 32.0–36.0)
MCV: 90.3 fL (ref 80.0–100.0)
MPV: 10 fL (ref 7.5–12.5)
Monocytes Relative: 7.6 %
Neutro Abs: 9018 cells/uL — ABNORMAL HIGH (ref 1500–7800)
Neutrophils Relative %: 66.8 %
Platelets: 348 10*3/uL (ref 140–400)
RBC: 4.74 10*6/uL (ref 3.80–5.10)
RDW: 13.2 % (ref 11.0–15.0)
Total Lymphocyte: 21.9 %
WBC: 13.5 10*3/uL — ABNORMAL HIGH (ref 3.8–10.8)

## 2019-10-13 LAB — TSH: TSH: 1.07 mIU/L

## 2019-10-27 NOTE — Telephone Encounter (Signed)
I called patient and read her the My Chart email about her results that [place] had sent her.

## 2020-06-13 DIAGNOSIS — W109XXA Fall (on) (from) unspecified stairs and steps, initial encounter: Secondary | ICD-10-CM | POA: Diagnosis not present

## 2020-06-13 DIAGNOSIS — S8012XA Contusion of left lower leg, initial encounter: Secondary | ICD-10-CM | POA: Diagnosis not present

## 2020-06-13 DIAGNOSIS — M7989 Other specified soft tissue disorders: Secondary | ICD-10-CM | POA: Diagnosis not present

## 2020-06-13 DIAGNOSIS — S8992XA Unspecified injury of left lower leg, initial encounter: Secondary | ICD-10-CM | POA: Diagnosis not present

## 2020-10-16 ENCOUNTER — Encounter: Payer: BC Managed Care – PPO | Admitting: Nurse Practitioner

## 2021-11-20 ENCOUNTER — Ambulatory Visit: Payer: Self-pay | Admitting: Nurse Practitioner

## 2021-12-12 NOTE — Progress Notes (Signed)
? ?  Gail Browning [birth year] [id number] ? ? ?History:  36 y.o. [id number] presents for annual exam. Monthly cycles. H/O PCOS, HSV. Normal pap history.  ? ?Gynecologic History ?Patient's last menstrual period was 11/15/2021. ?Period Cycle (Days): 28 ?Period Duration (Days): 5 ?Period Pattern: Regular ?Menstrual Flow: Moderate ?Dysmenorrhea: None ?Contraception/Family planning: tubal ligation ?Sexually active: Yes ? ?Health Maintenance ?Last Pap: 10/05/2018. Results were: Normal, 5-year repeat ?Last mammogram: Not indicated ?Last colonoscopy: Not indicated ?Last Dexa: Not indicated  ? ?Past medical history, past surgical history, family history and social history were all reviewed and documented in the EPIC chart. Married. 2 daughters ages 59 and 80. 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  ? ?ROS:  A ROS was performed and pertinent positives and negatives are included. ? ?Exam: ? ?Vitals:  ? 12/13/21 0939  ?BP: 124/74  ?Weight: 267 lb (121.1 kg)  ?Height: 5' 4.5" (1.638 m)  ? ?Body mass index is 45.12 kg/m?. ? ?General appearance:  Normal ?Thyroid:  Symmetrical, normal in size, without palpable masses or nodularity. ?Respiratory ? Auscultation:  Clear without wheezing or rhonchi ?Cardiovascular ? Auscultation:  Regular rate, without rubs, murmurs or gallops ? Edema/varicosities:  Not grossly evident ?Abdominal ? Soft,nontender, without masses, guarding or rebound. ? Liver/spleen:  No organomegaly noted ? Hernia:  None appreciated ? Skin ? Inspection:  Grossly normal ?Breasts: Examined lying and sitting.  ? Right: Without masses, retractions, nipple discharge or axillary adenopathy. ? ? Left: Without masses, retractions, nipple discharge or axillary adenopathy. ?Genitourinary  ? Inguinal/mons:  Normal without inguinal adenopathy ? External genitalia:  Normal appearing vulva with no masses, tenderness, or lesions ? BUS/Urethra/Skene's glands:  Normal ? Vagina:  Normal appearing with normal color and discharge, no  lesions ? Cervix:  Normal appearing without discharge or lesions ? Uterus:  Difficult to palpate due to body habitus but no gross masses or tenderness ? Adnexa/parametria:   ?  Rt: Normal in size, without masses or tenderness. ?  Lt: Normal in size, without masses or tenderness. ? Anus and perineum: Normal ? Digital rectal exam: Not indicated ? ? ?Patient informed chaperone available to be present for breast and pelvic exam. Patient has requested no chaperone to be present. Patient has been advised what will be completed during breast and pelvic exam.  ? ?Assessment/Plan:  36 y.o. [id number] for annual exam.  ? ?Well female exam with routine gynecological exam - Plan: CBC with Differential/Platelet, Comprehensive metabolic panel, Lipid panel. Education provided on SBEs, importance of preventative screenings, current guidelines, high calcium diet, regular exercise, and multivitamin daily.  ? ?Screening for cervical cancer - Normal Pap history.  Will repeat at 5-year interval per guidelines. ? ?Return in 1 year for annual.  ? ? ? ?Olivia Mackie DNP, 9:57 AM 12/13/2021 ? ?

## 2021-12-13 ENCOUNTER — Encounter: Payer: Self-pay | Admitting: Nurse Practitioner

## 2021-12-13 ENCOUNTER — Ambulatory Visit (INDEPENDENT_AMBULATORY_CARE_PROVIDER_SITE_OTHER): Payer: Self-pay | Admitting: Nurse Practitioner

## 2021-12-13 VITALS — BP 124/74 | Ht 64.5 in | Wt 267.0 lb

## 2021-12-13 DIAGNOSIS — Z01419 Encounter for gynecological examination (general) (routine) without abnormal findings: Secondary | ICD-10-CM

## 2021-12-13 LAB — COMPREHENSIVE METABOLIC PANEL
AG Ratio: 1.8 (calc) (ref 1.0–2.5)
ALT: 32 U/L — ABNORMAL HIGH (ref 6–29)
AST: 18 U/L (ref 10–30)
Albumin: 4.2 g/dL (ref 3.6–5.1)
Alkaline phosphatase (APISO): 118 U/L (ref 31–125)
BUN: 10 mg/dL (ref 7–25)
CO2: 24 mmol/L (ref 20–32)
Calcium: 8.9 mg/dL (ref 8.6–10.2)
Chloride: 109 mmol/L (ref 98–110)
Creat: 0.61 mg/dL (ref 0.50–0.97)
Globulin: 2.4 g/dL (calc) (ref 1.9–3.7)
Glucose, Bld: 88 mg/dL (ref 65–99)
Potassium: 4.2 mmol/L (ref 3.5–5.3)
Sodium: 138 mmol/L (ref 135–146)
Total Bilirubin: 0.7 mg/dL (ref 0.2–1.2)
Total Protein: 6.6 g/dL (ref 6.1–8.1)

## 2021-12-13 LAB — CBC WITH DIFFERENTIAL/PLATELET
Absolute Monocytes: 667 cells/uL (ref 200–950)
Basophils Absolute: 81 cells/uL (ref 0–200)
Basophils Relative: 0.7 %
Eosinophils Absolute: 437 cells/uL (ref 15–500)
Eosinophils Relative: 3.8 %
HCT: 40.4 % (ref 35.0–45.0)
Hemoglobin: 13.3 g/dL (ref 11.7–15.5)
Lymphs Abs: 2507 cells/uL (ref 850–3900)
MCH: 30.1 pg (ref 27.0–33.0)
MCHC: 32.9 g/dL (ref 32.0–36.0)
MCV: 91.4 fL (ref 80.0–100.0)
MPV: 10.4 fL (ref 7.5–12.5)
Monocytes Relative: 5.8 %
Neutro Abs: 7809 cells/uL — ABNORMAL HIGH (ref 1500–7800)
Neutrophils Relative %: 67.9 %
Platelets: 307 10*3/uL (ref 140–400)
RBC: 4.42 10*6/uL (ref 3.80–5.10)
RDW: 13 % (ref 11.0–15.0)
Total Lymphocyte: 21.8 %
WBC: 11.5 10*3/uL — ABNORMAL HIGH (ref 3.8–10.8)

## 2021-12-13 LAB — LIPID PANEL
Cholesterol: 149 mg/dL (ref <200)
HDL: 47 mg/dL — ABNORMAL LOW (ref >=50)
LDL Cholesterol (Calc): 86 mg/dL (calc)
Non-HDL Cholesterol (Calc): 102 mg/dL (calc) (ref <130)
Total CHOL/HDL Ratio: 3.2 (calc) (ref <5.0)
Triglycerides: 74 mg/dL (ref <150)

## 2024-05-21 ENCOUNTER — Other Ambulatory Visit: Payer: Self-pay
